# Patient Record
Sex: Female | Born: 1960
Health system: Southern US, Community
[De-identification: ages and names within clinical notes are randomized; demographics above are authoritative.]

## PROBLEM LIST (undated history)

## (undated) DIAGNOSIS — Q602 Renal agenesis, unspecified: Secondary | ICD-10-CM

## (undated) DIAGNOSIS — K219 Gastro-esophageal reflux disease without esophagitis: Secondary | ICD-10-CM

## (undated) DIAGNOSIS — G43909 Migraine, unspecified, not intractable, without status migrainosus: Secondary | ICD-10-CM

## (undated) DIAGNOSIS — T7840XA Allergy, unspecified, initial encounter: Secondary | ICD-10-CM

## (undated) HISTORY — PX: UPPER GASTROINTESTINAL ENDOSCOPY: SHX188

## (undated) HISTORY — PX: TONSILLECTOMY AND ADENOIDECTOMY: SHX28

## (undated) HISTORY — DX: Renal agenesis, unspecified: Q60.2

## (undated) HISTORY — DX: Allergy, unspecified, initial encounter: T78.40XA

## (undated) HISTORY — DX: Gastro-esophageal reflux disease without esophagitis: K21.9

## (undated) HISTORY — DX: Migraine, unspecified, not intractable, without status migrainosus: G43.909

## (undated) HISTORY — PX: RHINOPLASTY: SUR1284

---

## 1998-04-02 ENCOUNTER — Other Ambulatory Visit: Admission: RE | Admit: 1998-04-02 | Discharge: 1998-04-02 | Payer: Self-pay | Admitting: Obstetrics and Gynecology

## 1999-04-12 ENCOUNTER — Other Ambulatory Visit: Admission: RE | Admit: 1999-04-12 | Discharge: 1999-04-12 | Payer: Self-pay | Admitting: Gynecology

## 2000-04-11 ENCOUNTER — Other Ambulatory Visit: Admission: RE | Admit: 2000-04-11 | Discharge: 2000-04-11 | Payer: Self-pay | Admitting: Gynecology

## 2000-04-22 ENCOUNTER — Other Ambulatory Visit: Admission: RE | Admit: 2000-04-22 | Discharge: 2000-04-22 | Payer: Self-pay | Admitting: Gynecology

## 2001-04-13 ENCOUNTER — Other Ambulatory Visit: Admission: RE | Admit: 2001-04-13 | Discharge: 2001-04-13 | Payer: Self-pay | Admitting: Gynecology

## 2002-03-04 ENCOUNTER — Other Ambulatory Visit: Admission: RE | Admit: 2002-03-04 | Discharge: 2002-03-04 | Payer: Self-pay | Admitting: Gynecology

## 2002-09-04 ENCOUNTER — Encounter: Admission: RE | Admit: 2002-09-04 | Discharge: 2002-09-04 | Payer: Self-pay | Admitting: Family Medicine

## 2002-09-04 ENCOUNTER — Encounter: Payer: Self-pay | Admitting: Family Medicine

## 2003-03-18 ENCOUNTER — Other Ambulatory Visit: Admission: RE | Admit: 2003-03-18 | Discharge: 2003-03-18 | Payer: Self-pay | Admitting: Gynecology

## 2003-07-18 ENCOUNTER — Other Ambulatory Visit: Admission: RE | Admit: 2003-07-18 | Discharge: 2003-07-18 | Payer: Self-pay | Admitting: Gynecology

## 2003-10-16 ENCOUNTER — Encounter: Admission: RE | Admit: 2003-10-16 | Discharge: 2003-10-16 | Payer: Self-pay | Admitting: Gynecology

## 2004-06-02 ENCOUNTER — Other Ambulatory Visit: Admission: RE | Admit: 2004-06-02 | Discharge: 2004-06-02 | Payer: Self-pay | Admitting: Gynecology

## 2004-08-17 ENCOUNTER — Ambulatory Visit: Payer: Self-pay | Admitting: Family Medicine

## 2004-10-12 ENCOUNTER — Ambulatory Visit: Payer: Self-pay | Admitting: Family Medicine

## 2004-10-28 ENCOUNTER — Encounter: Admission: RE | Admit: 2004-10-28 | Discharge: 2004-10-28 | Payer: Self-pay | Admitting: Gynecology

## 2005-06-06 ENCOUNTER — Other Ambulatory Visit: Admission: RE | Admit: 2005-06-06 | Discharge: 2005-06-06 | Payer: Self-pay | Admitting: Gynecology

## 2005-07-21 ENCOUNTER — Ambulatory Visit: Payer: Self-pay | Admitting: Family Medicine

## 2005-08-19 HISTORY — PX: AUGMENTATION MAMMAPLASTY: SUR837

## 2005-11-24 ENCOUNTER — Encounter: Admission: RE | Admit: 2005-11-24 | Discharge: 2005-11-24 | Payer: Self-pay | Admitting: Gynecology

## 2006-01-05 ENCOUNTER — Ambulatory Visit: Payer: Self-pay | Admitting: Family Medicine

## 2006-01-06 ENCOUNTER — Ambulatory Visit: Payer: Self-pay | Admitting: Gastroenterology

## 2006-01-19 ENCOUNTER — Ambulatory Visit: Payer: Self-pay | Admitting: Gastroenterology

## 2006-01-20 ENCOUNTER — Ambulatory Visit: Payer: Self-pay | Admitting: Gastroenterology

## 2006-01-30 ENCOUNTER — Encounter (INDEPENDENT_AMBULATORY_CARE_PROVIDER_SITE_OTHER): Payer: Self-pay | Admitting: Specialist

## 2006-01-30 ENCOUNTER — Ambulatory Visit: Payer: Self-pay | Admitting: Gastroenterology

## 2006-02-06 ENCOUNTER — Ambulatory Visit: Payer: Self-pay | Admitting: Family Medicine

## 2006-02-22 ENCOUNTER — Ambulatory Visit: Payer: Self-pay | Admitting: Gastroenterology

## 2006-02-23 ENCOUNTER — Ambulatory Visit: Payer: Self-pay | Admitting: Internal Medicine

## 2006-05-30 ENCOUNTER — Ambulatory Visit: Payer: Self-pay | Admitting: Gastroenterology

## 2006-06-08 ENCOUNTER — Other Ambulatory Visit: Admission: RE | Admit: 2006-06-08 | Discharge: 2006-06-08 | Payer: Self-pay | Admitting: Gynecology

## 2006-11-28 ENCOUNTER — Encounter: Admission: RE | Admit: 2006-11-28 | Discharge: 2006-11-28 | Payer: Self-pay | Admitting: Gynecology

## 2007-06-11 ENCOUNTER — Other Ambulatory Visit: Admission: RE | Admit: 2007-06-11 | Discharge: 2007-06-11 | Payer: Self-pay | Admitting: Gynecology

## 2008-01-22 ENCOUNTER — Encounter: Admission: RE | Admit: 2008-01-22 | Discharge: 2008-01-22 | Payer: Self-pay | Admitting: Gynecology

## 2008-06-25 ENCOUNTER — Ambulatory Visit: Payer: Self-pay | Admitting: Gynecology

## 2008-06-25 ENCOUNTER — Other Ambulatory Visit: Admission: RE | Admit: 2008-06-25 | Discharge: 2008-06-25 | Payer: Self-pay | Admitting: Gynecology

## 2008-06-25 ENCOUNTER — Encounter: Payer: Self-pay | Admitting: Gynecology

## 2008-07-21 ENCOUNTER — Ambulatory Visit: Payer: Self-pay | Admitting: Gynecology

## 2009-01-28 ENCOUNTER — Encounter: Admission: RE | Admit: 2009-01-28 | Discharge: 2009-01-28 | Payer: Self-pay | Admitting: Gynecology

## 2009-04-16 ENCOUNTER — Ambulatory Visit: Payer: Self-pay | Admitting: Gynecology

## 2009-07-01 ENCOUNTER — Other Ambulatory Visit: Admission: RE | Admit: 2009-07-01 | Discharge: 2009-07-01 | Payer: Self-pay | Admitting: Gynecology

## 2009-07-01 ENCOUNTER — Encounter: Payer: Self-pay | Admitting: Gynecology

## 2009-07-01 ENCOUNTER — Ambulatory Visit: Payer: Self-pay | Admitting: Gynecology

## 2010-02-03 ENCOUNTER — Encounter: Admission: RE | Admit: 2010-02-03 | Discharge: 2010-02-03 | Payer: Self-pay | Admitting: Gynecology

## 2010-07-02 ENCOUNTER — Ambulatory Visit: Payer: Self-pay | Admitting: Gynecology

## 2010-07-15 ENCOUNTER — Other Ambulatory Visit
Admission: RE | Admit: 2010-07-15 | Discharge: 2010-07-15 | Payer: Self-pay | Source: Home / Self Care | Admitting: Gynecology

## 2010-07-15 ENCOUNTER — Ambulatory Visit: Payer: Self-pay | Admitting: Gynecology

## 2010-07-16 ENCOUNTER — Ambulatory Visit: Payer: Self-pay | Admitting: Gynecology

## 2010-07-16 HISTORY — PX: INTRAUTERINE DEVICE INSERTION: SHX323

## 2010-07-20 ENCOUNTER — Ambulatory Visit: Payer: Self-pay | Admitting: Gynecology

## 2010-08-20 ENCOUNTER — Ambulatory Visit: Payer: Self-pay | Admitting: Gynecology

## 2010-10-10 ENCOUNTER — Encounter: Payer: Self-pay | Admitting: Gynecology

## 2010-12-17 ENCOUNTER — Ambulatory Visit
Admission: RE | Admit: 2010-12-17 | Discharge: 2010-12-17 | Disposition: A | Discharge status: Home / Self Care | Payer: BC Managed Care – PPO | Source: Ambulatory Visit | Attending: Otolaryngology | Admitting: Otolaryngology

## 2010-12-17 ENCOUNTER — Other Ambulatory Visit: Payer: Self-pay | Admitting: Otolaryngology

## 2010-12-17 ENCOUNTER — Other Ambulatory Visit: Payer: Self-pay

## 2010-12-17 DIAGNOSIS — H9120 Sudden idiopathic hearing loss, unspecified ear: Secondary | ICD-10-CM

## 2010-12-17 DIAGNOSIS — R42 Dizziness and giddiness: Secondary | ICD-10-CM

## 2011-01-05 ENCOUNTER — Other Ambulatory Visit: Payer: Self-pay | Admitting: Gynecology

## 2011-01-05 DIAGNOSIS — Z1231 Encounter for screening mammogram for malignant neoplasm of breast: Secondary | ICD-10-CM

## 2011-02-04 NOTE — Assessment & Plan Note (Signed)
[facility]                           GASTROENTEROLOGY OFFICE NOTE   NAME:Ho, Andrea HACKBART                        MRN:          [id number]  DATE:05/30/2006                            DOB:          06/26/1961    Return office visit for dyspepsia and GERD.  She states her gastrointestinal  symptoms were not very active this summer, particularly when she was on  vacation.  She notes that with increasing stress her symptoms seem to be  more active, and she has more foods that seem to precipitate reflux  symptoms.  She still has problems with gas and bloating, and these symptoms  are helped with the use of Levsin.  She would like to try other proton pump  inhibitors to see if her reflux symptoms can be better controlled.   MEDICATIONS:  Listed on the chart, updated and reviewed.   MEDICATION ALLERGIES:  CODEINE.   PHYSICAL EXAMINATION:  In no acute distress.  Weight 132.8 pounds, blood  pressure is 112/76, pulse is 64 and regular.  CHEST:  Clear to auscultation bilaterally.  CARDIAC:  Regular rate and rhythm without murmurs.  ABDOMEN:  Soft and nontender, with normal active bowel sounds, no palpable  organomegaly, masses or hernias.   ASSESSMENT AND PLAN:  Gastroesophageal reflux disease and dyspepsia.  Rule  out gastroparesis.  Trial of Zegerid 40 mg q.a.m. for one week, and Nexium  40 mg q.a.m. for one week.  She will call to notify us which proton pump  inhibitor seems to be more effective.  We may need to try a proton pump  inhibitor twice a day on occasion.  Continue the regular use of Levsin for  gas bloating and abdominal discomfort.  Return office visit one year.                                   Venita Lick. Russella Dar, MD, [facility]   MTS/MedQ  DD:  05/31/2006  DT:  06/01/2006  Job #:  [id number]

## 2011-02-15 ENCOUNTER — Ambulatory Visit: Payer: BC Managed Care – PPO

## 2011-02-23 ENCOUNTER — Ambulatory Visit
Admission: RE | Admit: 2011-02-23 | Discharge: 2011-02-23 | Disposition: A | Discharge status: Home / Self Care | Payer: BC Managed Care – PPO | Source: Ambulatory Visit | Attending: Gynecology | Admitting: Gynecology

## 2011-02-23 DIAGNOSIS — Z1231 Encounter for screening mammogram for malignant neoplasm of breast: Secondary | ICD-10-CM

## 2011-07-20 ENCOUNTER — Ambulatory Visit (INDEPENDENT_AMBULATORY_CARE_PROVIDER_SITE_OTHER): Payer: BC Managed Care – PPO | Admitting: Gynecology

## 2011-07-20 ENCOUNTER — Other Ambulatory Visit (HOSPITAL_COMMUNITY)
Admission: RE | Admit: 2011-07-20 | Discharge: 2011-07-20 | Disposition: A | Discharge status: Home / Self Care | Payer: BC Managed Care – PPO | Source: Ambulatory Visit | Attending: Gynecology | Admitting: Gynecology

## 2011-07-20 ENCOUNTER — Encounter: Payer: Self-pay | Admitting: Gynecology

## 2011-07-20 VITALS — BP 118/70 | Ht 65.0 in | Wt 125.0 lb

## 2011-07-20 DIAGNOSIS — Z01419 Encounter for gynecological examination (general) (routine) without abnormal findings: Secondary | ICD-10-CM

## 2011-07-20 DIAGNOSIS — N898 Other specified noninflammatory disorders of vagina: Secondary | ICD-10-CM

## 2011-07-20 DIAGNOSIS — B373 Candidiasis of vulva and vagina: Secondary | ICD-10-CM

## 2011-07-20 DIAGNOSIS — L293 Anogenital pruritus, unspecified: Secondary | ICD-10-CM

## 2011-07-20 DIAGNOSIS — N951 Menopausal and female climacteric states: Secondary | ICD-10-CM

## 2011-07-20 DIAGNOSIS — N952 Postmenopausal atrophic vaginitis: Secondary | ICD-10-CM

## 2011-07-20 DIAGNOSIS — Z1211 Encounter for screening for malignant neoplasm of colon: Secondary | ICD-10-CM

## 2011-07-20 MED ORDER — ESTROGENS, CONJUGATED 0.625 MG/GM VA CREA: TOPICAL_CREAM | VAGINAL | Status: DC

## 2011-07-20 MED ORDER — FLUCONAZOLE 150 MG PO TABS: 150.0000 mg | ORAL_TABLET | Freq: Once | ORAL | Status: AC

## 2011-07-20 NOTE — Progress Notes (Signed)
Andrea Ho 08-07-1961 [id number]   History:    50 y.o.  for annual exam with the only complaint is of vulvar parotid is. Patient has been menopausal for ovary or a year since the ParaGard T380A IUD was placed. She does have some vasomotor symptoms and vaginal dryness but she has been hesitant on starting any hormone replacement therapy. She does suffer from dyspareunia as a result of her vaginal atrophy. Review of her record indicated her mammogram was in April of this year which was normal. Patient does her monthly self breast examinations. She had saline implants.  Past medical history,surgical history, family history and social history were all reviewed and documented in the EPIC chart.  ROS:  Was performed and pertinent positives and negatives are included in the history.  Exam: chaperone present BP 118/70  Ht 5\' 5"  (1.651 m)  Wt 125 lb (56.7 kg)  BMI 20.80 kg/m2  Body mass index is 20.80 kg/(m^2).  General appearance : Well developed well nourished female. No acute distress HEENT: Neck supple, trachea midline, no carotid bruits, no thyroidmegaly Lungs: Clear to auscultation, no rhonchi or wheezes, or rib retractions  Heart: Regular rate and rhythm, no murmurs or gallops Breast:Examined in sitting and supine position were symmetrical in appearance, no palpable masses or tenderness,  no skin retraction, no nipple inversion, no nipple discharge, no skin discoloration, no axillary or supraclavicular lymphadenopathy Abdomen: no palpable masses or tenderness, no rebound or guarding Extremities: no edema or skin discoloration or tenderness  Pelvic:  Bartholin, Urethra, Skene Glands: Within normal limits             Vagina: No gross lesions or discharge  Cervix: No gross lesions or discharge IUD string seen  Uterus  anteverted, normal size, shape and consistency, non-tender and mobile  Adnexa  Without masses or tenderness  Anus and perineum  normal   Rectovaginal  normal sphincter tone  without palpated masses or tenderness             Hemoccult done resulting in time of this dictation     Assessment/Plan:  50 y.o. female for annual exam with wet prep demonstrated evidence of moniliasis. She'll be placed on Diflucan 150 mg one by mouth today. We discussed low-dose vaginal estrogen cream to apply twice a week for her severe vaginal atrophy and dyspareunia. The risks benefits and pros and cons were discussed. She'll be started on Premarin vaginal cream to apply twice a week. She'll continue to monitor her blood pressures. Patient is being followed by her primary physician who does her lab work so no additional lap will be drawn today. Her urinalysis was negative and her Pap smear was done today. She was reminded to schedule her screening colonoscopy. And we will plan next year on getting a baseline bone density study. She was instructed to take her calcium and vitamin D twice a day along with weightbearing exercises for osteoporosis prevention.    Ok Edwards MD, 1:31 PM 07/20/2011

## 2011-07-20 NOTE — Progress Notes (Signed)
Addended by: Cammie Mcgee T on: 07/20/2011 02:44 PM   Modules accepted: Orders

## 2011-07-20 NOTE — Patient Instructions (Signed)
Apply premarin vaginal cream twice a week and continue to monitor blood pressure. This low dose twice a week should not make a change but never the less lets keep an eye on it. The diflucan tablet is for the yeast infection just take one.

## 2012-01-02 ENCOUNTER — Encounter: Payer: Self-pay | Admitting: Gynecology

## 2012-01-02 ENCOUNTER — Encounter: Payer: Self-pay | Admitting: *Deleted

## 2012-01-02 ENCOUNTER — Ambulatory Visit (INDEPENDENT_AMBULATORY_CARE_PROVIDER_SITE_OTHER): Payer: BC Managed Care – PPO | Admitting: Gynecology

## 2012-01-02 DIAGNOSIS — Q600 Renal agenesis, unilateral: Secondary | ICD-10-CM | POA: Insufficient documentation

## 2012-01-02 DIAGNOSIS — N952 Postmenopausal atrophic vaginitis: Secondary | ICD-10-CM

## 2012-01-02 DIAGNOSIS — R1031 Right lower quadrant pain: Secondary | ICD-10-CM

## 2012-01-02 DIAGNOSIS — N898 Other specified noninflammatory disorders of vagina: Secondary | ICD-10-CM

## 2012-01-02 DIAGNOSIS — R109 Unspecified abdominal pain: Secondary | ICD-10-CM

## 2012-01-02 DIAGNOSIS — L293 Anogenital pruritus, unspecified: Secondary | ICD-10-CM

## 2012-01-02 LAB — URINALYSIS W MICROSCOPIC + REFLEX CULTURE
Ketones, ur: NEGATIVE mg/dL
Nitrite: NEGATIVE
Protein, ur: NEGATIVE mg/dL
Specific Gravity, Urine: <1.005 — ABNORMAL LOW (ref 1.005–1.030)
Urobilinogen, UA: 0.2 mg/dL (ref 0.0–1.0)

## 2012-01-02 LAB — WET PREP FOR TRICH, YEAST, CLUE
Clue Cells Wet Prep HPF POC: NONE SEEN
Trich, Wet Prep: NONE SEEN
WBC, Wet Prep HPF POC: NONE SEEN

## 2012-01-02 MED ORDER — ESTROGENS, CONJUGATED 0.625 MG/GM VA CREA: TOPICAL_CREAM | VAGINAL | Status: DC

## 2012-01-02 MED ORDER — FLUCONAZOLE 100 MG PO TABS: 100.0000 mg | ORAL_TABLET | Freq: Every day | ORAL | Status: AC

## 2012-01-02 NOTE — Patient Instructions (Signed)
Menopause Menopause is the normal time of life when menstrual periods stop completely. Menopause is complete when you have missed 12 consecutive menstrual periods. It usually occurs between the ages of 48 to 55, with an average age of 51. Very rarely does a woman develop menopause before 51 years old. At menopause, your ovaries stop producing the female hormones, estrogen and progesterone. This can cause undesirable symptoms and also affect your health. Sometimes the symptoms may occur 4 to 5 years before the menopause begins. There is no relationship between menopause and:  Oral contraceptives.   Number of children you had.   Race.   The age your menstrual periods started (menarche).  Heavy smokers and very thin women may develop menopause earlier in life. CAUSES  The ovaries stop producing the female hormones estrogen and progesterone.   Other causes include:   Surgery to remove both ovaries.   The ovaries stop functioning for no known reason.   Tumors of the pituitary gland in the brain.   Medical disease that affects the ovaries and hormone production.   Radiation treatment to the abdomen or pelvis.   Chemotherapy that affects the ovaries.  SYMPTOMS   Hot flashes.   Night sweats.   Decrease in sex drive.   Vaginal dryness and thinning of the vagina causing painful intercourse.   Dryness of the skin and developing wrinkles.   Headaches.   Tiredness.   Irritability.   Memory problems.   Weight gain.   Bladder infections.   Hair growth of the face and chest.   Infertility.  More serious symptoms include:  Loss of bone (osteoporosis) causing breaks (fractures).   Depression.   Hardening and narrowing of the arteries (atherosclerosis) causing heart attacks and strokes.  DIAGNOSIS   When the menstrual periods have stopped for 12 straight months.   Physical exam.   Hormone studies of the blood.  TREATMENT  There are many treatment choices and nearly  as many questions about them. The decisions to treat or not to treat menopausal changes is an individual choice made with your caregiver. Your caregiver can discuss the treatments with you. Together, you can decide which treatment will work best for you. Your treatment choices may include:   Hormone therapy (estorgen and progesterone).   Non-hormonal medications.   Treating the individual symptoms with medication (for example antidepressants for depression).   Herbal medications that may help specific symptoms.   Counseling by a psychiatrist or psychologist.   Group therapy.   Lifestyle changes including:   Eating healthy.   Regular exercise.   Limiting caffeine and alcohol.   Stress management and meditation.   No treatment.  HOME CARE INSTRUCTIONS   Take the medication your caregiver gives you as directed.   Get plenty of sleep and rest.   Exercise regularly.   Eat a diet that contains calcium (good for the bones) and soy products (acts like estrogen hormone).   Avoid alcoholic beverages.   Do not smoke.   If you have hot flashes, dress in layers.   Take supplements, calcium and vitamin D to strengthen bones.   You can use over-the-counter lubricants or moisturizers for vaginal dryness.   Group therapy is sometimes very helpful.   Acupuncture may be helpful in some cases.  SEEK MEDICAL CARE IF:   You are not sure you are in menopause.   You are having menopausal symptoms and need advice and treatment.   You are still having menstrual periods after age 55.     You have pain with intercourse.   Menopause is complete (no menstrual period for 12 months) and you develop vaginal bleeding.   You need a referral to a specialist (gynecologist, psychiatrist or psychologist) for treatment.  SEEK IMMEDIATE MEDICAL CARE IF:   You have severe depression.   You have excessive vaginal bleeding.   You fell and think you have a broken bone.   You have pain when you  urinate.   You develop leg or chest pain.   You have a fast pounding heart beat (palpitations).   You have severe headaches.   You develop vision problems.   You feel a lump in your breast.   You have abdominal pain or severe indigestion.  Document Released: 11/26/2003 Document Revised: 08/25/2011 Document Reviewed: 07/03/2008 [facility] Patient Information 2012 [place], [place].  Apply the Premarin Vaginal Cream every night for 1 week then twice a week there after for vaginal health  The Diflucan is ready to pick up at the pharmacy for the yeast just take one tablet tonight

## 2012-01-02 NOTE — Progress Notes (Signed)
Patient presented to the office today complaining of vulvar pruritus. In 2011 [facility] had demonstrated that she was menopausal. And had issues with vaginal atrophy. She had been prescribed Premarin vaginal cream to apply twice a week but never started it because of concern or her blood pressure. She does have a ParaGard T380A IUD that was placed 2 years ago. She denies any vaginal bleeding. She has a history of congenital right renal agenesis. She has some vague suprapubic discomfort and was concerned she may have a urinary tract infection. She denied any dysuria or frequency or back pain.  Exam: Bartholin urethra Skene glands with atrophic changes Vagina: No gross lesions or discharge atrophy was noted. Cervix: IUD string was seen Uterus: Normal size shape and consistency anteverted Adnexa: No palpable masses or tenderness Rectal: Not examined  Wet prep evidence of moniliasis / urinalysis negative  Assessment/plan: Wet prep with moniliasis. Diflucan 150 mg was prescribed. She would like to restart the Premarin vaginal cream. I've asked her to apply intravaginally and external vulva each bedtime for one week then to apply twice a week thereafter. Women's health initiative study was discussed. She'll continue to monitor her blood pressure. She scheduled to return back to the office in October of this year. Literature information on the menopause was provided.

## 2012-01-02 NOTE — Progress Notes (Signed)
Addended by: Ok Edwards on: 01/02/2012 02:34 PM   Modules accepted: Orders

## 2012-01-02 NOTE — Progress Notes (Signed)
Patient ID: Andrea Ho, female   DOB: May 13, 1961, 51 y.o.   MRN: [id number] Pt was given diflucan 150 mg tablet today #2. Pt thought JF only said 1 pill. Pt informed to take 1 today and 1 pill tomorrow.

## 2012-01-03 ENCOUNTER — Telehealth: Payer: Self-pay | Admitting: *Deleted

## 2012-01-03 NOTE — Telephone Encounter (Signed)
For the first week when she starts that she is applied intravaginally every night she should use half applicator. After the first week when she is orally going to be using it twice a week she should use the full applicator and apply some on her fingers to apply externally as well for the atrophy

## 2012-01-03 NOTE — Telephone Encounter (Signed)
Pt was giving premarin 0.625 tube and was told to to full applicator about half way, which would be about 1 1/2 gram. Pt wanted to know if this was enough? Pt read the booklet and it said use cherry size amount. Please advise

## 2012-01-03 NOTE — Telephone Encounter (Signed)
Pt informed with the below note,  

## 2012-01-16 ENCOUNTER — Other Ambulatory Visit: Payer: Self-pay | Admitting: Gynecology

## 2012-01-16 DIAGNOSIS — Z1231 Encounter for screening mammogram for malignant neoplasm of breast: Secondary | ICD-10-CM

## 2012-02-24 ENCOUNTER — Ambulatory Visit
Admission: RE | Admit: 2012-02-24 | Discharge: 2012-02-24 | Disposition: A | Discharge status: Home / Self Care | Payer: BC Managed Care – PPO | Source: Ambulatory Visit | Attending: Gynecology | Admitting: Gynecology

## 2012-02-24 DIAGNOSIS — Z1231 Encounter for screening mammogram for malignant neoplasm of breast: Secondary | ICD-10-CM

## 2012-06-04 ENCOUNTER — Telehealth: Payer: Self-pay | Admitting: *Deleted

## 2012-06-04 NOTE — Telephone Encounter (Signed)
Pt left message on voicemail c/o IUD string? Left message on pt voicemail OV best.

## 2012-08-24 ENCOUNTER — Other Ambulatory Visit: Payer: Self-pay | Admitting: Obstetrics and Gynecology

## 2012-08-24 ENCOUNTER — Other Ambulatory Visit (HOSPITAL_COMMUNITY)
Admission: RE | Admit: 2012-08-24 | Discharge: 2012-08-24 | Disposition: A | Discharge status: Home / Self Care | Payer: BC Managed Care – PPO | Source: Ambulatory Visit | Attending: Obstetrics and Gynecology | Admitting: Obstetrics and Gynecology

## 2012-08-24 DIAGNOSIS — Z01419 Encounter for gynecological examination (general) (routine) without abnormal findings: Secondary | ICD-10-CM | POA: Insufficient documentation

## 2013-01-03 ENCOUNTER — Encounter: Payer: Self-pay | Admitting: Gastroenterology

## 2013-01-24 ENCOUNTER — Other Ambulatory Visit: Payer: Self-pay

## 2013-01-24 DIAGNOSIS — Z1231 Encounter for screening mammogram for malignant neoplasm of breast: Secondary | ICD-10-CM

## 2013-01-25 ENCOUNTER — Ambulatory Visit (AMBULATORY_SURGERY_CENTER): Payer: No Typology Code available for payment source | Admitting: *Deleted

## 2013-01-25 VITALS — Ht 66.0 in | Wt 130.0 lb

## 2013-01-25 DIAGNOSIS — Z1211 Encounter for screening for malignant neoplasm of colon: Secondary | ICD-10-CM

## 2013-01-25 MED ORDER — MOVIPREP 100 G PO SOLR: ORAL | Status: DC

## 2013-02-05 ENCOUNTER — Telehealth: Payer: Self-pay | Admitting: Gastroenterology

## 2013-02-05 NOTE — Telephone Encounter (Signed)
Yes charge 

## 2013-02-07 ENCOUNTER — Encounter: Payer: BC Managed Care – PPO | Admitting: Gastroenterology

## 2013-03-05 ENCOUNTER — Telehealth: Payer: Self-pay | Admitting: Gastroenterology

## 2013-03-05 NOTE — Telephone Encounter (Signed)
Message copied by Arna Snipe on Tue Mar 05, 2013 11:41 AM ------      Message from: Mckinley Jewel, AMY L      Created: Wed Feb 06, 2013 11:40 AM      Regarding: FW: cancellation charge                   ----- Message -----         From: Domingo Sep         Sent: 02/06/2013  11:16 AM           To: Otto Herb, CNA      Subject: RE: cancellation charge                                  Don't charge.  Patient's husband died last 2023-02-06.            ----- Message -----         From: Otto Herb, CNA         Sent: 02/06/2013  10:37 AM           To: Domingo Sep      Subject: cancellation charge                                      Pt called to cancel because husband passed away 2023/02/04.  Dr. Russella Dar is saying to charge her :( can you check on this? I won't send to [facility] yet.            Thanks Amy       ------

## 2013-03-29 ENCOUNTER — Ambulatory Visit
Admission: RE | Admit: 2013-03-29 | Discharge: 2013-03-29 | Disposition: A | Discharge status: Home / Self Care | Payer: BC Managed Care – PPO | Source: Ambulatory Visit

## 2013-03-29 DIAGNOSIS — Z1231 Encounter for screening mammogram for malignant neoplasm of breast: Secondary | ICD-10-CM

## 2013-10-01 ENCOUNTER — Other Ambulatory Visit (HOSPITAL_COMMUNITY)
Admission: RE | Admit: 2013-10-01 | Discharge: 2013-10-01 | Disposition: A | Discharge status: Home / Self Care | Payer: BC Managed Care – PPO | Source: Ambulatory Visit | Attending: Obstetrics and Gynecology | Admitting: Obstetrics and Gynecology

## 2013-10-01 ENCOUNTER — Other Ambulatory Visit: Payer: Self-pay | Admitting: Obstetrics and Gynecology

## 2013-10-01 DIAGNOSIS — Z124 Encounter for screening for malignant neoplasm of cervix: Secondary | ICD-10-CM | POA: Insufficient documentation

## 2013-10-01 DIAGNOSIS — Z1151 Encounter for screening for human papillomavirus (HPV): Secondary | ICD-10-CM | POA: Insufficient documentation

## 2014-01-20 ENCOUNTER — Other Ambulatory Visit: Payer: Self-pay | Admitting: Dermatology

## 2014-02-26 ENCOUNTER — Other Ambulatory Visit: Payer: Self-pay

## 2014-02-26 DIAGNOSIS — Z1231 Encounter for screening mammogram for malignant neoplasm of breast: Secondary | ICD-10-CM

## 2014-04-02 ENCOUNTER — Ambulatory Visit
Admission: RE | Admit: 2014-04-02 | Discharge: 2014-04-02 | Disposition: A | Discharge status: Home / Self Care | Payer: BC Managed Care – PPO | Source: Ambulatory Visit

## 2014-04-02 DIAGNOSIS — Z1231 Encounter for screening mammogram for malignant neoplasm of breast: Secondary | ICD-10-CM

## 2014-04-11 ENCOUNTER — Telehealth: Payer: Self-pay | Admitting: Cardiology

## 2014-04-11 NOTE — Telephone Encounter (Signed)
New message           C/o cp when exercising / not a constant pain but comes and goes

## 2014-04-11 NOTE — Telephone Encounter (Signed)
Called stating she has noticed recently that she is more SOB when she is exercising.  States her heart rate goes up to 120; also c/o being more fatigued. Last seen by Dr. Anne FuSkains 04/04/12. Received records for 7/17 from [facility] system.  Made her an appointment 8/5 with Sunday SpillersLori Gerhardt,NP.  Advised if continues to have SOB to call back.  Needs to stay hydrated and avoid excessive exercise during hot weather. She understands and agrees to plan.

## 2014-04-19 ENCOUNTER — Encounter: Payer: Self-pay | Admitting: *Deleted

## 2014-04-23 ENCOUNTER — Other Ambulatory Visit: Payer: Self-pay | Admitting: *Deleted

## 2014-04-23 ENCOUNTER — Ambulatory Visit (INDEPENDENT_AMBULATORY_CARE_PROVIDER_SITE_OTHER): Payer: BC Managed Care – PPO | Admitting: Nurse Practitioner

## 2014-04-23 ENCOUNTER — Telehealth: Payer: Self-pay | Admitting: *Deleted

## 2014-04-23 ENCOUNTER — Encounter: Payer: Self-pay | Admitting: Nurse Practitioner

## 2014-04-23 VITALS — BP 140/92 | HR 65 | Ht 66.0 in | Wt 132.8 lb

## 2014-04-23 DIAGNOSIS — R0602 Shortness of breath: Secondary | ICD-10-CM

## 2014-04-23 DIAGNOSIS — I10 Essential (primary) hypertension: Secondary | ICD-10-CM

## 2014-04-23 DIAGNOSIS — R0789 Other chest pain: Secondary | ICD-10-CM

## 2014-04-23 DIAGNOSIS — R079 Chest pain, unspecified: Secondary | ICD-10-CM

## 2014-04-23 LAB — BASIC METABOLIC PANEL
BUN: 15 mg/dL (ref 6–23)
CO2: 30 mEq/L (ref 19–32)
Calcium: 9.3 mg/dL (ref 8.4–10.5)
Chloride: 104 mEq/L (ref 96–112)
Creatinine, Ser: 0.9 mg/dL (ref 0.4–1.2)
GFR: 72.4 mL/min (ref >60.00)
Glucose, Bld: 74 mg/dL (ref 70–99)
Potassium: 4.3 mEq/L (ref 3.5–5.1)
Sodium: 139 mEq/L (ref 135–145)

## 2014-04-23 LAB — HEPATIC FUNCTION PANEL
ALT: 17 U/L (ref 0–35)
AST: 27 U/L (ref 0–37)
Albumin: 4.1 g/dL (ref 3.5–5.2)
Alkaline Phosphatase: 69 U/L (ref 39–117)
Bilirubin, Direct: 0.1 mg/dL (ref 0.0–0.3)
Total Bilirubin: 0.7 mg/dL (ref 0.2–1.2)
Total Protein: 6.9 g/dL (ref 6.0–8.3)

## 2014-04-23 LAB — LIPID PANEL
Cholesterol: 222 mg/dL — ABNORMAL HIGH (ref 0–200)
HDL: 81.4 mg/dL (ref >39.00)
LDL Cholesterol: 125 mg/dL — ABNORMAL HIGH (ref 0–99)
NonHDL: 140.6
Total CHOL/HDL Ratio: 3
Triglycerides: 80 mg/dL (ref 0.0–149.0)
VLDL: 16 mg/dL (ref 0.0–40.0)

## 2014-04-23 LAB — CBC
HCT: 45.2 % (ref 36.0–46.0)
Hemoglobin: 14.9 g/dL (ref 12.0–15.0)
MCHC: 33 g/dL (ref 30.0–36.0)
MCV: 92.9 fl (ref 78.0–100.0)
Platelets: 185 10*3/uL (ref 150.0–400.0)
RBC: 4.87 Mil/uL (ref 3.87–5.11)
RDW: 12.8 % (ref 11.5–15.5)
WBC: 6.6 10*3/uL (ref 4.0–10.5)

## 2014-04-23 LAB — TSH: TSH: 4.03 u[IU]/mL (ref 0.35–4.50)

## 2014-04-23 MED ORDER — ATORVASTATIN CALCIUM 10 MG PO TABS: 10.0000 mg | ORAL_TABLET | Freq: Every day | ORAL | Status: DC

## 2014-04-23 NOTE — Telephone Encounter (Signed)
Left message on machine for pt to contact the office. Pt was set up to get Exercise stress test tomorrow pre-cert contacted us, pt's insurance will not cover test.  Norma FredricksonLori Gerhardt contacted ins co and still would not cover test.  Sent to schedulers pt will  Have to get a POET.  Waiting for pt to return call.

## 2014-04-23 NOTE — Patient Instructions (Addendum)
Stay on your current medicines  You may add back your Omeprazole for the next 2 weeks to see if this helps you feel better  We will check labs today  We will arrange for a stress Myoview  Call the [facility] office at [phone] if you have any questions, problems or concerns.

## 2014-04-23 NOTE — Progress Notes (Addendum)
Andrea Ho Date of Birth: 1961-06-03 Medical Record #[id number]  History of Present Illness: [name] is seen back today for a work in visit. Seen for Dr. Anne Fu. She is a 53 year old female with a history of tachycardia with exercise - evaluated back in Feb 15, 2012 -  Low risk GXT and normal echo. She has never smoked. She is an LPN. Her other issues include situational stress (husband died in 02/14/13 with an MI - resulted in bankruptcy and foreclosure), migraines, solitary left kidney due to congenital defect, and mild HTN.  Last seen by Dr. Anne Fu in 2012-02-15.  Called about 10 days ago with more DOE with exercise. HR up to 120. More fatigued.   Comes in today. Here alone. Has had a pressure/squeezing sensation around her neck and in both arms - been going on for several months - comes and goes but nothing she can do really triggers. Worse with stress but still has when she does not feel stressed. HR going up too quick for her. She remains active. Eating out. No recent labs. Overall fatigued and "can't go like I use to". Short of breath with exertion. If she bends over, she feels the blood rushing to her head. No longer on her PPI - was worried about long term effects. Still with lots of stress with 77 year old son going to college. BP has been mildly elevated but not consistent. Admits to using too much salt and eating out too much. Says her last check of her lipids were up.  Current Outpatient Prescriptions  Medication Sig Dispense Refill  . calcium carbonate (OS-CAL) 600 MG TABS Take 600 mg by mouth 2 (two) times daily with a meal.        . clindamycin (CLEOCIN) 150 MG capsule Take 150 mg by mouth 4 (four) times daily.       . Multiple Vitamins-Minerals (OCUVITE PO) Take by mouth as needed.       . rizatriptan (MAXALT) 10 MG tablet Take 10 mg by mouth as needed. May repeat in 2 hours if needed       . omeprazole (PRILOSEC) 20 MG capsule Take 20 mg by mouth daily.       No current  facility-administered medications for this visit.    Allergies  Allergen Reactions  . Bactrim Nausea Only  . Codeine Nausea And Vomiting  . Other     mushrooms    Past Medical History  Diagnosis Date  . Migraines   . Allergy     mushrooms  . GERD (gastroesophageal reflux disease)   . Renal agenesis     CONGENITAL, RIGHT,  only kidney    Past Surgical History  Procedure Laterality Date  . Intrauterine device insertion  07/16/2010    PARAGUARD  . Tonsillectomy and adenoidectomy    . Rhinoplasty    . Augmentation mammaplasty  08/2005    SALINE,  DR. BEAN    History  Smoking status  . Never Smoker   Smokeless tobacco  . Never Used    History  Alcohol Use  . 2.4 oz/week  . 2 Glasses of wine, 2 Cans of beer per week    Comment: 3 - 4 times a week.Marland KitchenBEER, WINE    Family History  Problem Relation Age of Onset  . Hypertension Mother   . Heart attack Mother 24  . Cervical cancer Mother   . Hypertension Father   . Stroke Father     hx of  TIA'S  . Colon cancer Paternal Grandmother [id number]  . Heart attack Father   . Hypertension Sister     Review of Systems: The review of systems is per the HPI.  All other systems were reviewed and are negative.  Physical Exam: BP 140/92  Pulse 65  Ht 5\' 6"  (1.676 m)  Wt 132 lb 12.8 oz (60.238 kg)  BMI 21.44 kg/m2 Patient is very pleasant and in no acute distress. Skin is warm and dry. Color is normal.  HEENT is unremarkable. Normocephalic/atraumatic. PERRL. Sclera are nonicteric. Neck is supple. No masses. No JVD. Lungs are clear. Cardiac exam shows a regular rate and rhythm. Abdomen is soft. Extremities are without edema. Gait and ROM are intact. No gross neurologic deficits noted.  Wt Readings from Last 3 Encounters:  04/23/14 132 lb 12.8 oz (60.238 kg)  01/25/13 130 lb (58.968 kg)  07/20/11 125 lb (56.7 kg)    LABORATORY DATA/PROCEDURES: EKG with sinus rhythm. T wave inversion in V2. Reviewed with Dr. Anne FuSkains  No results  found for this basename: WBC,  HGB,  HCT,  PLT,  GLUCOSE,  CHOL,  TRIG,  HDL,  LDLDIRECT,  LDLCALC,  ALT,  AST,  NA,  K,  CL,  CREATININE,  BUN,  CO2,  TSH,  PSA,  INR,  GLUF,  HGBA1C,  MICROALBUR    BNP (last 3 results) No results found for this basename: PROBNP,  in the last 8760 hours   Assessment / Plan: 1. DOE/fatigue/neck pressure/arm pain - discussed with Dr. Anne FuSkains - will proceed on with stress Myoview. She has several risk factors - borderline HTN, strong FH of early CAD and EKG changes today.   2. Tachycardia with exercise - has had prior low risk GXT and normal echo from 2013.  3. HTN - will need to monitor  Will check her labs. Would treat lipids if LDL not below 100. Proceed with stress Myoview. Further disposition to follow.   Patient is agreeable to this plan and will call if any problems develop in the interim.   Rosalio MacadamiaLori C. Chares Slaymaker, RN, ANP-C [facility] [address] [place], [place]  [id number] [phone]   Addendum:  Have tried to get prior auth for the Myoview. Have done peer to peer review with patient's insurance company to no avail. Despite her symptoms, history and EKG she is not approved for a Myoview. The insurance company has cancelled the Myoview. She will need to have a GXT - if this is abnormal, then can have the Myoview.  Patient will be notified.   Rosalio MacadamiaLori C. Danniel Grenz, RN, ANP-C [facility] [address] [place], [place]  [address] [phone]

## 2014-04-23 NOTE — Telephone Encounter (Signed)
S/w pt was very upset insurance will not cover test pt is calling ins agent and will get back to me

## 2014-04-24 ENCOUNTER — Encounter (HOSPITAL_COMMUNITY): Payer: BC Managed Care – PPO

## 2014-04-30 ENCOUNTER — Encounter (HOSPITAL_COMMUNITY): Payer: BC Managed Care – PPO

## 2014-05-20 ENCOUNTER — Telehealth (HOSPITAL_COMMUNITY): Payer: Self-pay

## 2014-05-20 NOTE — Telephone Encounter (Signed)
Encounter complete. 

## 2014-05-22 ENCOUNTER — Ambulatory Visit (HOSPITAL_COMMUNITY)
Admission: RE | Admit: 2014-05-22 | Discharge: 2014-05-22 | Disposition: A | Discharge status: Home / Self Care | Payer: BC Managed Care – PPO | Source: Ambulatory Visit | Attending: Internal Medicine | Admitting: Internal Medicine

## 2014-05-22 DIAGNOSIS — R0602 Shortness of breath: Secondary | ICD-10-CM

## 2014-05-22 DIAGNOSIS — R079 Chest pain, unspecified: Secondary | ICD-10-CM | POA: Diagnosis not present

## 2014-05-22 DIAGNOSIS — R0789 Other chest pain: Secondary | ICD-10-CM

## 2014-05-22 NOTE — Procedures (Signed)
Exercise Treadmill Test  Pre-Exercise Testing Evaluation  NSR, normal tracing  Test  Exercise Tolerance Test Ordering MD: Norma Fredrickson, NP    Unique Test No: 1   Treadmill:  1  Indication for ETT: chest pain - rule out ischemia  Contraindication to ETT: No   Stress Modality: exercise - treadmill  Cardiac Imaging Performed: non   Protocol: standard Bruce - maximal  Max BP:  168/101  Max MPHR (bpm):  167 85% MPR (bpm):  142  MPHR obtained (bpm):  166 % MPHR obtained:  99  Reached 85% MPHR (min:sec):  9:30 Total Exercise Time (min-sec):  11:39   Workload in METS:  13.4 Borg Scale: 14  Reason ETT Terminated:  Fatigue and Weakness    ST Segment Analysis At Rest: normal ST segments - no evidence of significant ST depression With Exercise: no evidence of significant ST depression  Other Information Arrhythmia:  rare blocked PACs in recovery Angina during ETT:  absent (0) Quality of ETT:  diagnostic  ETT Interpretation:  normal - no evidence of ischemia by ST analysis  Comments: Good exercise tolerance Hypertensive response at peak exercise.  Thurmon Fair, MD, [facility] [phone] office [phone] pager

## 2014-05-23 ENCOUNTER — Telehealth: Payer: Self-pay | Admitting: *Deleted

## 2014-05-23 NOTE — Telephone Encounter (Signed)
lmom myoview normal;  

## 2014-07-10 ENCOUNTER — Other Ambulatory Visit: Payer: Self-pay | Admitting: Nurse Practitioner

## 2014-07-10 DIAGNOSIS — N644 Mastodynia: Secondary | ICD-10-CM

## 2014-07-21 ENCOUNTER — Encounter: Payer: Self-pay | Admitting: Nurse Practitioner

## 2014-07-22 ENCOUNTER — Other Ambulatory Visit: Payer: BC Managed Care – PPO

## 2014-07-24 ENCOUNTER — Ambulatory Visit
Admission: RE | Admit: 2014-07-24 | Discharge: 2014-07-24 | Disposition: A | Discharge status: Home / Self Care | Payer: BC Managed Care – PPO | Source: Ambulatory Visit | Attending: Nurse Practitioner | Admitting: Nurse Practitioner

## 2014-07-24 DIAGNOSIS — N644 Mastodynia: Secondary | ICD-10-CM

## 2014-08-26 HISTORY — PX: BREAST IMPLANT EXCHANGE: SHX6296

## 2015-09-07 ENCOUNTER — Other Ambulatory Visit (HOSPITAL_COMMUNITY)
Admission: RE | Admit: 2015-09-07 | Discharge: 2015-09-07 | Disposition: A | Discharge status: Home / Self Care | Payer: BLUE CROSS/BLUE SHIELD | Source: Ambulatory Visit | Attending: Obstetrics and Gynecology | Admitting: Obstetrics and Gynecology

## 2015-09-07 ENCOUNTER — Other Ambulatory Visit: Payer: Self-pay | Admitting: Obstetrics and Gynecology

## 2015-09-07 DIAGNOSIS — Z01419 Encounter for gynecological examination (general) (routine) without abnormal findings: Secondary | ICD-10-CM | POA: Diagnosis present

## 2015-09-09 LAB — CYTOLOGY - PAP

## 2016-01-20 DIAGNOSIS — Z13220 Encounter for screening for lipoid disorders: Secondary | ICD-10-CM | POA: Diagnosis not present

## 2016-01-20 DIAGNOSIS — Z0000 Encounter for general adult medical examination without abnormal findings: Secondary | ICD-10-CM | POA: Diagnosis not present

## 2016-02-12 DIAGNOSIS — L821 Other seborrheic keratosis: Secondary | ICD-10-CM | POA: Diagnosis not present

## 2016-02-12 DIAGNOSIS — L814 Other melanin hyperpigmentation: Secondary | ICD-10-CM | POA: Diagnosis not present

## 2016-02-12 DIAGNOSIS — D1801 Hemangioma of skin and subcutaneous tissue: Secondary | ICD-10-CM | POA: Diagnosis not present

## 2016-02-12 DIAGNOSIS — L905 Scar conditions and fibrosis of skin: Secondary | ICD-10-CM | POA: Diagnosis not present

## 2016-05-31 ENCOUNTER — Encounter (INDEPENDENT_AMBULATORY_CARE_PROVIDER_SITE_OTHER): Payer: Self-pay

## 2016-05-31 ENCOUNTER — Ambulatory Visit (INDEPENDENT_AMBULATORY_CARE_PROVIDER_SITE_OTHER): Payer: BLUE CROSS/BLUE SHIELD | Admitting: Gastroenterology

## 2016-05-31 ENCOUNTER — Encounter: Payer: Self-pay | Admitting: Gastroenterology

## 2016-05-31 VITALS — BP 104/68 | HR 84 | Ht 64.75 in | Wt 134.5 lb

## 2016-05-31 DIAGNOSIS — Z1211 Encounter for screening for malignant neoplasm of colon: Secondary | ICD-10-CM

## 2016-05-31 DIAGNOSIS — R112 Nausea with vomiting, unspecified: Secondary | ICD-10-CM | POA: Diagnosis not present

## 2016-05-31 DIAGNOSIS — Z9889 Other specified postprocedural states: Secondary | ICD-10-CM | POA: Diagnosis not present

## 2016-05-31 MED ORDER — PROMETHAZINE HCL 25 MG RE SUPP: RECTAL | 0 refills | Status: AC

## 2016-05-31 MED ORDER — NA SULFATE-K SULFATE-MG SULF 17.5-3.13-1.6 GM/177ML PO SOLN: 1.0000 | Freq: Once | ORAL | 0 refills | Status: AC

## 2016-05-31 NOTE — Patient Instructions (Signed)
You have been scheduled for a colonoscopy. Please follow written instructions given to you at your visit today.  Please pick up your prep supplies at the pharmacy within the next 1-3 days. If you use inhalers (even only as needed), please bring them with you on the day of your procedure. Your physician has requested that you go to www.startemmi.com and enter the access code given to you at your visit today. This web site gives a general overview about your procedure. However, you should still follow specific instructions given to you by our office regarding your preparation for the procedure.  Thank you for choosing me and [facility].  Malcolm T. Stark, Jr., MD., FACG  

## 2016-05-31 NOTE — Progress Notes (Addendum)
    History of Present Illness: This is a 55 year old female self referred for CRC screening and history of post op/post anesthesia nausea and vomiting. She had an appts for colonoscopy in 2014 however she cancelled due the unexpected death of her husband. She has had problems with post op/post anesthesia nausea and vomiting is very concerned about this potential side effect from anesthesia for colonoscopy. She wanted to discuss possibility of proceeding without anesthesia. She underwent EGD in 01/2006 with only Cetacaine spray was able to tolerate the procedure. Findings included mild GERD and mild gastritis. No family history of colon cancer or colon polyps. Denies weight loss, abdominal pain, constipation, diarrhea, change in stool caliber, melena, hematochezia, nausea, vomiting, dysphagia, reflux symptoms, chest pain.    Review of Systems: Pertinent positive and negative review of systems were noted in the above HPI section. All other review of systems were otherwise negative.  Current Medications, Allergies, Past Medical History, Past Surgical History, Family History and Social History were reviewed in [facility] electronic medical record.  Physical Exam: General: Well developed, well nourished, no acute distress Head: Normocephalic and atraumatic Eyes:  sclerae anicteric, EOMI Ears: Normal auditory acuity Mouth: No deformity or lesions Neck: Supple, no masses or thyromegaly Lungs: Clear throughout to auscultation Heart: Regular rate and rhythm; no murmurs, rubs or bruits Abdomen: Soft, non tender and non distended. No masses, hepatosplenomegaly or hernias noted. Normal Bowel sounds Rectal: deferred to colonoscopy Musculoskeletal: Symmetrical with no gross deformities  Skin: No lesions on visible extremities Pulses:  Normal pulses noted Extremities: No clubbing, cyanosis, edema or deformities noted Neurological: Alert oriented x 4, grossly nonfocal Cervical Nodes:  No significant  cervical adenopathy Inguinal Nodes: No significant inguinal adenopathy Psychological:  Alert and cooperative. Normal mood and affect  Assessment and Recommendations:   1. CRC screening, average risk. History of postanesthesia nausea and vomiting. Patient is reassured that propofol anesthesia rarely has this side effect. Advised that we would give Zofran intravenously prior to the procedure and Phenergan suppositories were prescribed for after the procedure if she has any difficulty at home. We discussed the possibility of colonoscopy without IV anesthesia and after discussion she preferred to proceed with MAC using intravenous propofol. The risks (including bleeding, perforation, infection, missed lesions, medication reactions and possible hospitalization or surgery if complications occur), benefits, and alternatives to colonoscopy with possible biopsy and possible polypectomy were discussed with the patient and they consent to proceed.

## 2016-06-30 ENCOUNTER — Encounter: Payer: Self-pay | Admitting: Gastroenterology

## 2016-07-11 ENCOUNTER — Telehealth: Payer: Self-pay | Admitting: Gastroenterology

## 2016-07-11 MED ORDER — PEG 3350-KCL-NABCB-NACL-NASULF 227.1 G PO SOLR: ORAL | 0 refills | Status: DC

## 2016-07-11 MED ORDER — PEG 3350-KCL-NABCB-NACL-NASULF 227.1 G PO PACK: PACK | ORAL | 0 refills | Status: DC

## 2016-07-11 NOTE — Telephone Encounter (Signed)
Left a message for patient to return my call. 

## 2016-07-11 NOTE — Telephone Encounter (Signed)
Patient states she wants the Golytely prep instead and will come by our office today to pick up the new instructions.

## 2016-07-11 NOTE — Telephone Encounter (Signed)
Spoke with patient and informed her we do not normally do PA for preps because insurance companies do not pay for them even when we do the PA's for them. Informed patient that there is an alternative to Suprep if she cannot afford the prep. Patient states she will call her pharmacy and find out which one she can afford and will call me back. Did inform patient that she will need new instructions for the Golytely prep if she decides to pick that one. Patient verbalized understanding.

## 2016-07-12 ENCOUNTER — Encounter: Payer: BLUE CROSS/BLUE SHIELD | Admitting: Gastroenterology

## 2016-07-13 ENCOUNTER — Ambulatory Visit (AMBULATORY_SURGERY_CENTER): Payer: BLUE CROSS/BLUE SHIELD | Admitting: Gastroenterology

## 2016-07-13 ENCOUNTER — Encounter: Payer: Self-pay | Admitting: Gastroenterology

## 2016-07-13 VITALS — BP 115/77 | HR 60 | Temp 98.2°F | Resp 16 | Ht 64.75 in | Wt 134.0 lb

## 2016-07-13 DIAGNOSIS — Z1212 Encounter for screening for malignant neoplasm of rectum: Secondary | ICD-10-CM

## 2016-07-13 DIAGNOSIS — Z1211 Encounter for screening for malignant neoplasm of colon: Secondary | ICD-10-CM | POA: Diagnosis present

## 2016-07-13 MED ORDER — SODIUM CHLORIDE 0.9 % IV SOLN: 500.0000 mL | INTRAVENOUS | Status: AC

## 2016-07-13 NOTE — Op Note (Signed)
[facility] Patient Name: Andrea Ho Procedure Date: 07/13/2016 7:55 AM MRN: [id number] Endoscopist: Meryl Dare , MD Age: 55 Referring MD:  Date of Birth: Feb 16, 1961 Gender: Female Account #: [id number] Procedure:                Colonoscopy Indications:              Screening for colorectal malignant neoplasm Medicines:                Monitored Anesthesia Care Procedure:                Pre-Anesthesia Assessment:                           - Prior to the procedure, a History and Physical                            was performed, and patient medications and                            allergies were reviewed. The patient's tolerance of                            previous anesthesia was also reviewed. The risks                            and benefits of the procedure and the sedation                            options and risks were discussed with the patient.                            All questions were answered, and informed consent                            was obtained. Prior Anticoagulants: The patient has                            taken no previous anticoagulant or antiplatelet                            agents. ASA Grade Assessment: II - A patient with                            mild systemic disease. After reviewing the risks                            and benefits, the patient was deemed in                            satisfactory condition to undergo the procedure.                           After obtaining informed consent, the colonoscope  was passed under direct vision. Throughout the                            procedure, the patient's blood pressure, pulse, and                            oxygen saturations were monitored continuously. The                            Model PCF-[id number]) scope was introduced                            through the anus and advanced to the the cecum,                            identified by  appendiceal orifice and ileocecal                            valve. The ileocecal valve, appendiceal orifice,                            and rectum were photographed. The quality of the                            bowel preparation was good. The colonoscopy was                            performed without difficulty. The patient tolerated                            the procedure well. Scope In: 8:43:51 AM Scope Out: 9:03:32 AM Scope Withdrawal Time: 0 hours 12 minutes 1 second  Total Procedure Duration: 0 hours 19 minutes 41 seconds  Findings:                 The perianal and digital rectal examinations were                            normal.                           The entire examined colon appeared normal on direct                            and retroflexion views. Complications:            No immediate complications. Estimated blood loss:                            None. Estimated Blood Loss:     Estimated blood loss: none. Impression:               - The entire examined colon is normal on direct and                            retroflexion views.                           -  No specimens collected. Recommendation:           - Repeat colonoscopy in 10 years for screening                            purposes.                           - Patient has a contact number available for                            emergencies. The signs and symptoms of potential                            delayed complications were discussed with the                            patient. Return to normal activities tomorrow.                            Written discharge instructions were provided to the                            patient.                           - Resume previous diet.                           - Continue present medications. Meryl Dare, MD 07/13/2016 9:10:20 AM This report has been signed electronically.

## 2016-07-13 NOTE — Progress Notes (Signed)
Pt states she only wants the "lightest possible sedation" d/t hx of nausea and vomiting after anesthesia.  Explained that Propofol is the drug used for sedation and that it has an anti-emetic in the medication.  She will speak with her CRNA before the procedure.

## 2016-07-13 NOTE — Progress Notes (Signed)
To PACU  Awake and spont resp. Report to RN

## 2016-07-13 NOTE — Patient Instructions (Signed)
YOU HAD AN ENDOSCOPIC PROCEDURE TODAY AT THE [facility]:   Refer to the procedure report that was given to you for any specific questions about what was found during the examination.  If the procedure report does not answer your questions, please call your gastroenterologist to clarify.  If you requested that your care partner not be given the details of your procedure findings, then the procedure report has been included in a sealed envelope for you to review at your convenience later.  YOU SHOULD EXPECT: Some feelings of bloating in the abdomen. Passage of more gas than usual.  Walking can help get rid of the air that was put into your GI tract during the procedure and reduce the bloating. If you had a lower endoscopy (such as a colonoscopy or flexible sigmoidoscopy) you may notice spotting of blood in your stool or on the toilet paper. If you underwent a bowel prep for your procedure, you may not have a normal bowel movement for a few days.  Please Note:  You might notice some irritation and congestion in your nose or some drainage.  This is from the oxygen used during your procedure.  There is no need for concern and it should clear up in a day or so.  SYMPTOMS TO REPORT IMMEDIATELY:   Following lower endoscopy (colonoscopy or flexible sigmoidoscopy):  Excessive amounts of blood in the stool  Significant tenderness or worsening of abdominal pains  Swelling of the abdomen that is new, acute  Fever of 100F or higher   For urgent or emergent issues, a gastroenterologist can be reached at any hour by calling [phone].   DIET:  We do recommend a small meal at first, but then you may proceed to your regular diet.  Drink plenty of fluids but you should avoid alcoholic beverages for 24 hours.  ACTIVITY:  You should plan to take it easy for the rest of today and you should NOT DRIVE or use heavy machinery until tomorrow (because of the sedation medicines used during the test).     FOLLOW UP: Our staff will call the number listed on your records the next business day following your procedure to check on you and address any questions or concerns that you may have regarding the information given to you following your procedure. If we do not reach you, we will leave a message.  However, if you are feeling well and you are not experiencing any problems, there is no need to return our call.  We will assume that you have returned to your regular daily activities without incident.  If any biopsies were taken you will be contacted by phone or by letter within the next 1-3 weeks.  Please call us at [phone] if you have not heard about the biopsies in 3 weeks.    SIGNATURES/CONFIDENTIALITY: You and/or your care partner have signed paperwork which will be entered into your electronic medical record.  These signatures attest to the fact that that the information above on your After Visit Summary has been reviewed and is understood.  Full responsibility of the confidentiality of this discharge information lies with you and/or your care-partner.   Resume medications.  

## 2016-07-14 ENCOUNTER — Telehealth: Payer: Self-pay | Admitting: *Deleted

## 2016-07-14 ENCOUNTER — Telehealth: Payer: Self-pay

## 2016-07-14 DIAGNOSIS — L570 Actinic keratosis: Secondary | ICD-10-CM | POA: Diagnosis not present

## 2016-07-14 DIAGNOSIS — L309 Dermatitis, unspecified: Secondary | ICD-10-CM | POA: Diagnosis not present

## 2016-07-14 DIAGNOSIS — L821 Other seborrheic keratosis: Secondary | ICD-10-CM | POA: Diagnosis not present

## 2016-07-14 NOTE — Telephone Encounter (Signed)
No answer message left to call back with questions and concerns. Will attempt to call back this afternoon. [email or web address] Rn

## 2016-07-14 NOTE — Telephone Encounter (Signed)
  Follow up Call-  Call back number 07/13/2016  Post procedure Call Back phone  # [phone]  Permission to leave phone message Yes  Some recent data might be hidden     Patient was called for follow up after her procedure on 07/13/2016. No answer at the number given for follow up phone call. A message was left on the answering machine.

## 2016-07-15 ENCOUNTER — Telehealth: Payer: Self-pay

## 2016-07-15 NOTE — Telephone Encounter (Signed)
  Follow up Call-  Call back number 07/13/2016  Post procedure Call Back phone  # [phone]  Permission to leave phone message Yes  Some recent data might be hidden     Patient questions:  Do you have a fever, pain , or abdominal swelling? No. Pain Score  0 *  Have you tolerated food without any problems? Yes.    Have you been able to return to your normal activities? Yes.    Do you have any questions about your discharge instructions: Diet   No. Medications  No. Follow up visit  No.  Do you have questions or concerns about your Care? No.  Actions: * If pain score is 4 or above: No action needed, pain <4.

## 2016-07-15 NOTE — Telephone Encounter (Signed)
Pt called back and said she is doing fine after her procedure

## 2016-08-16 DIAGNOSIS — L570 Actinic keratosis: Secondary | ICD-10-CM | POA: Diagnosis not present

## 2016-08-16 DIAGNOSIS — D1801 Hemangioma of skin and subcutaneous tissue: Secondary | ICD-10-CM | POA: Diagnosis not present

## 2016-09-07 ENCOUNTER — Other Ambulatory Visit (HOSPITAL_COMMUNITY)
Admission: RE | Admit: 2016-09-07 | Discharge: 2016-09-07 | Disposition: A | Discharge status: Home / Self Care | Payer: BLUE CROSS/BLUE SHIELD | Source: Ambulatory Visit | Attending: Obstetrics and Gynecology | Admitting: Obstetrics and Gynecology

## 2016-09-07 ENCOUNTER — Other Ambulatory Visit: Payer: Self-pay | Admitting: Obstetrics and Gynecology

## 2016-09-07 DIAGNOSIS — R87810 Cervical high risk human papillomavirus (HPV) DNA test positive: Secondary | ICD-10-CM | POA: Diagnosis not present

## 2016-09-07 DIAGNOSIS — Z01419 Encounter for gynecological examination (general) (routine) without abnormal findings: Secondary | ICD-10-CM | POA: Diagnosis not present

## 2016-09-07 DIAGNOSIS — N898 Other specified noninflammatory disorders of vagina: Secondary | ICD-10-CM | POA: Diagnosis not present

## 2016-09-07 DIAGNOSIS — Z113 Encounter for screening for infections with a predominantly sexual mode of transmission: Secondary | ICD-10-CM | POA: Diagnosis not present

## 2016-09-07 DIAGNOSIS — R102 Pelvic and perineal pain: Secondary | ICD-10-CM | POA: Diagnosis not present

## 2016-09-07 DIAGNOSIS — Z1151 Encounter for screening for human papillomavirus (HPV): Secondary | ICD-10-CM | POA: Diagnosis not present

## 2016-09-15 LAB — CYTOLOGY - PAP
Chlamydia: NEGATIVE
Diagnosis: NEGATIVE
HPV 16/18/45 genotyping: NEGATIVE
HPV: DETECTED — AB
Neisseria Gonorrhea: NEGATIVE

## 2016-09-29 DIAGNOSIS — R102 Pelvic and perineal pain: Secondary | ICD-10-CM | POA: Diagnosis not present

## 2016-10-04 ENCOUNTER — Other Ambulatory Visit: Payer: Self-pay | Admitting: Obstetrics and Gynecology

## 2016-10-04 DIAGNOSIS — Z1231 Encounter for screening mammogram for malignant neoplasm of breast: Secondary | ICD-10-CM

## 2016-10-31 ENCOUNTER — Ambulatory Visit
Admission: RE | Admit: 2016-10-31 | Discharge: 2016-10-31 | Disposition: A | Discharge status: Home / Self Care | Payer: BLUE CROSS/BLUE SHIELD | Source: Ambulatory Visit | Attending: Obstetrics and Gynecology | Admitting: Obstetrics and Gynecology

## 2016-10-31 DIAGNOSIS — Z1231 Encounter for screening mammogram for malignant neoplasm of breast: Secondary | ICD-10-CM | POA: Diagnosis not present

## 2017-01-26 DIAGNOSIS — E785 Hyperlipidemia, unspecified: Secondary | ICD-10-CM | POA: Diagnosis not present

## 2017-01-26 DIAGNOSIS — Z0000 Encounter for general adult medical examination without abnormal findings: Secondary | ICD-10-CM | POA: Diagnosis not present

## 2017-02-24 DIAGNOSIS — R5383 Other fatigue: Secondary | ICD-10-CM | POA: Diagnosis not present

## 2017-02-24 DIAGNOSIS — R002 Palpitations: Secondary | ICD-10-CM | POA: Diagnosis not present

## 2017-02-24 DIAGNOSIS — R0789 Other chest pain: Secondary | ICD-10-CM | POA: Diagnosis not present

## 2017-07-17 DIAGNOSIS — L820 Inflamed seborrheic keratosis: Secondary | ICD-10-CM | POA: Diagnosis not present

## 2017-07-17 DIAGNOSIS — R202 Paresthesia of skin: Secondary | ICD-10-CM | POA: Diagnosis not present

## 2017-07-17 DIAGNOSIS — L853 Xerosis cutis: Secondary | ICD-10-CM | POA: Diagnosis not present

## 2017-07-17 DIAGNOSIS — L821 Other seborrheic keratosis: Secondary | ICD-10-CM | POA: Diagnosis not present

## 2017-07-17 DIAGNOSIS — D225 Melanocytic nevi of trunk: Secondary | ICD-10-CM | POA: Diagnosis not present

## 2017-11-10 ENCOUNTER — Other Ambulatory Visit: Payer: Self-pay | Admitting: Obstetrics and Gynecology

## 2017-11-10 ENCOUNTER — Other Ambulatory Visit: Payer: Self-pay | Admitting: Family Medicine

## 2017-11-10 DIAGNOSIS — Z139 Encounter for screening, unspecified: Secondary | ICD-10-CM

## 2017-11-20 DIAGNOSIS — B349 Viral infection, unspecified: Secondary | ICD-10-CM | POA: Diagnosis not present

## 2017-11-20 DIAGNOSIS — J0100 Acute maxillary sinusitis, unspecified: Secondary | ICD-10-CM | POA: Diagnosis not present

## 2017-12-05 ENCOUNTER — Ambulatory Visit
Admission: RE | Admit: 2017-12-05 | Discharge: 2017-12-05 | Disposition: A | Discharge status: Home / Self Care | Payer: BLUE CROSS/BLUE SHIELD | Source: Ambulatory Visit | Attending: Family Medicine | Admitting: Family Medicine

## 2017-12-05 DIAGNOSIS — Z139 Encounter for screening, unspecified: Secondary | ICD-10-CM

## 2017-12-05 DIAGNOSIS — Z1231 Encounter for screening mammogram for malignant neoplasm of breast: Secondary | ICD-10-CM | POA: Diagnosis not present

## 2017-12-26 DIAGNOSIS — R509 Fever, unspecified: Secondary | ICD-10-CM | POA: Diagnosis not present

## 2017-12-26 DIAGNOSIS — B349 Viral infection, unspecified: Secondary | ICD-10-CM | POA: Diagnosis not present

## 2018-02-05 DIAGNOSIS — N952 Postmenopausal atrophic vaginitis: Secondary | ICD-10-CM | POA: Diagnosis not present

## 2018-02-05 DIAGNOSIS — G43009 Migraine without aura, not intractable, without status migrainosus: Secondary | ICD-10-CM | POA: Diagnosis not present

## 2018-02-05 DIAGNOSIS — E785 Hyperlipidemia, unspecified: Secondary | ICD-10-CM | POA: Diagnosis not present

## 2018-02-05 DIAGNOSIS — Z0000 Encounter for general adult medical examination without abnormal findings: Secondary | ICD-10-CM | POA: Diagnosis not present

## 2018-04-27 DIAGNOSIS — R1011 Right upper quadrant pain: Secondary | ICD-10-CM | POA: Diagnosis not present

## 2018-06-29 DIAGNOSIS — N6320 Unspecified lump in the left breast, unspecified quadrant: Secondary | ICD-10-CM | POA: Diagnosis not present

## 2018-06-29 DIAGNOSIS — R946 Abnormal results of thyroid function studies: Secondary | ICD-10-CM | POA: Diagnosis not present

## 2018-07-02 ENCOUNTER — Other Ambulatory Visit: Payer: Self-pay | Admitting: Family Medicine

## 2018-07-02 DIAGNOSIS — N6320 Unspecified lump in the left breast, unspecified quadrant: Secondary | ICD-10-CM

## 2018-07-04 ENCOUNTER — Ambulatory Visit
Admission: RE | Admit: 2018-07-04 | Discharge: 2018-07-04 | Disposition: A | Discharge status: Home / Self Care | Payer: BLUE CROSS/BLUE SHIELD | Source: Ambulatory Visit | Attending: Family Medicine | Admitting: Family Medicine

## 2018-07-04 ENCOUNTER — Other Ambulatory Visit: Payer: Self-pay | Admitting: Family Medicine

## 2018-07-04 DIAGNOSIS — R922 Inconclusive mammogram: Secondary | ICD-10-CM | POA: Diagnosis not present

## 2018-07-04 DIAGNOSIS — N6320 Unspecified lump in the left breast, unspecified quadrant: Secondary | ICD-10-CM

## 2018-07-04 DIAGNOSIS — N6489 Other specified disorders of breast: Secondary | ICD-10-CM | POA: Diagnosis not present

## 2018-07-05 DIAGNOSIS — L814 Other melanin hyperpigmentation: Secondary | ICD-10-CM | POA: Diagnosis not present

## 2018-07-05 DIAGNOSIS — D229 Melanocytic nevi, unspecified: Secondary | ICD-10-CM | POA: Diagnosis not present

## 2018-07-05 DIAGNOSIS — L821 Other seborrheic keratosis: Secondary | ICD-10-CM | POA: Diagnosis not present

## 2018-07-05 DIAGNOSIS — L570 Actinic keratosis: Secondary | ICD-10-CM | POA: Diagnosis not present

## 2018-07-05 DIAGNOSIS — D1801 Hemangioma of skin and subcutaneous tissue: Secondary | ICD-10-CM | POA: Diagnosis not present

## 2018-08-23 DIAGNOSIS — H9312 Tinnitus, left ear: Secondary | ICD-10-CM | POA: Diagnosis not present

## 2018-08-23 DIAGNOSIS — H903 Sensorineural hearing loss, bilateral: Secondary | ICD-10-CM | POA: Diagnosis not present

## 2018-08-30 ENCOUNTER — Other Ambulatory Visit: Payer: Self-pay | Admitting: Otolaryngology

## 2018-08-30 DIAGNOSIS — H9312 Tinnitus, left ear: Secondary | ICD-10-CM

## 2018-08-31 ENCOUNTER — Ambulatory Visit
Admission: RE | Admit: 2018-08-31 | Discharge: 2018-08-31 | Disposition: A | Discharge status: Home / Self Care | Payer: BLUE CROSS/BLUE SHIELD | Source: Ambulatory Visit | Attending: Otolaryngology | Admitting: Otolaryngology

## 2018-08-31 DIAGNOSIS — H9312 Tinnitus, left ear: Secondary | ICD-10-CM | POA: Diagnosis not present

## 2018-08-31 DIAGNOSIS — H9192 Unspecified hearing loss, left ear: Secondary | ICD-10-CM | POA: Diagnosis not present

## 2018-08-31 MED ORDER — GADOBENATE DIMEGLUMINE 529 MG/ML IV SOLN
12.0000 mL | Freq: Once | INTRAVENOUS | Status: AC | PRN
  Administered 2018-08-31: 12 mL via INTRAVENOUS

## 2018-09-02 ENCOUNTER — Other Ambulatory Visit: Payer: BLUE CROSS/BLUE SHIELD

## 2019-01-22 ENCOUNTER — Other Ambulatory Visit: Payer: Self-pay | Admitting: Family Medicine

## 2019-01-22 DIAGNOSIS — Z1231 Encounter for screening mammogram for malignant neoplasm of breast: Secondary | ICD-10-CM

## 2019-02-28 ENCOUNTER — Other Ambulatory Visit: Payer: Self-pay | Admitting: Family Medicine

## 2019-02-28 ENCOUNTER — Other Ambulatory Visit (HOSPITAL_COMMUNITY)
Admission: RE | Admit: 2019-02-28 | Discharge: 2019-02-28 | Disposition: A | Discharge status: Home / Self Care | Payer: BC Managed Care – PPO | Source: Ambulatory Visit | Attending: Family Medicine | Admitting: Family Medicine

## 2019-02-28 DIAGNOSIS — Z124 Encounter for screening for malignant neoplasm of cervix: Secondary | ICD-10-CM | POA: Diagnosis not present

## 2019-02-28 DIAGNOSIS — E7800 Pure hypercholesterolemia, unspecified: Secondary | ICD-10-CM | POA: Diagnosis not present

## 2019-02-28 DIAGNOSIS — Z8249 Family history of ischemic heart disease and other diseases of the circulatory system: Secondary | ICD-10-CM | POA: Diagnosis not present

## 2019-02-28 DIAGNOSIS — Z0000 Encounter for general adult medical examination without abnormal findings: Secondary | ICD-10-CM | POA: Diagnosis not present

## 2019-03-05 LAB — CYTOLOGY - PAP
Diagnosis: NEGATIVE
HPV: DETECTED — AB

## 2019-03-18 ENCOUNTER — Ambulatory Visit: Payer: BLUE CROSS/BLUE SHIELD

## 2019-04-26 ENCOUNTER — Ambulatory Visit: Payer: BC Managed Care – PPO

## 2019-05-14 DIAGNOSIS — D1801 Hemangioma of skin and subcutaneous tissue: Secondary | ICD-10-CM | POA: Diagnosis not present

## 2019-05-14 DIAGNOSIS — L821 Other seborrheic keratosis: Secondary | ICD-10-CM | POA: Diagnosis not present

## 2019-05-14 DIAGNOSIS — L814 Other melanin hyperpigmentation: Secondary | ICD-10-CM | POA: Diagnosis not present

## 2019-05-14 DIAGNOSIS — D225 Melanocytic nevi of trunk: Secondary | ICD-10-CM | POA: Diagnosis not present

## 2019-05-14 DIAGNOSIS — D485 Neoplasm of uncertain behavior of skin: Secondary | ICD-10-CM | POA: Diagnosis not present

## 2019-05-14 DIAGNOSIS — L820 Inflamed seborrheic keratosis: Secondary | ICD-10-CM | POA: Diagnosis not present

## 2019-05-26 DIAGNOSIS — Z20828 Contact with and (suspected) exposure to other viral communicable diseases: Secondary | ICD-10-CM | POA: Diagnosis not present

## 2019-07-01 ENCOUNTER — Ambulatory Visit
Admission: RE | Admit: 2019-07-01 | Discharge: 2019-07-01 | Disposition: A | Discharge status: Home / Self Care | Payer: BC Managed Care – PPO | Source: Ambulatory Visit | Attending: Family Medicine | Admitting: Family Medicine

## 2019-07-01 ENCOUNTER — Other Ambulatory Visit: Payer: Self-pay

## 2019-07-01 DIAGNOSIS — Z1231 Encounter for screening mammogram for malignant neoplasm of breast: Secondary | ICD-10-CM

## 2020-03-04 ENCOUNTER — Other Ambulatory Visit (HOSPITAL_COMMUNITY)
Admission: RE | Admit: 2020-03-04 | Discharge: 2020-03-04 | Disposition: A | Discharge status: Home / Self Care | Payer: BC Managed Care – PPO | Source: Ambulatory Visit | Attending: Family Medicine | Admitting: Family Medicine

## 2020-03-04 ENCOUNTER — Other Ambulatory Visit: Payer: Self-pay | Admitting: Family Medicine

## 2020-03-04 DIAGNOSIS — Z124 Encounter for screening for malignant neoplasm of cervix: Secondary | ICD-10-CM | POA: Insufficient documentation

## 2020-03-04 DIAGNOSIS — K219 Gastro-esophageal reflux disease without esophagitis: Secondary | ICD-10-CM | POA: Diagnosis not present

## 2020-03-04 DIAGNOSIS — Z0000 Encounter for general adult medical examination without abnormal findings: Secondary | ICD-10-CM | POA: Diagnosis not present

## 2020-03-04 DIAGNOSIS — R87618 Other abnormal cytological findings on specimens from cervix uteri: Secondary | ICD-10-CM | POA: Diagnosis not present

## 2020-03-04 DIAGNOSIS — E785 Hyperlipidemia, unspecified: Secondary | ICD-10-CM | POA: Diagnosis not present

## 2020-03-04 DIAGNOSIS — G43009 Migraine without aura, not intractable, without status migrainosus: Secondary | ICD-10-CM | POA: Diagnosis not present

## 2020-03-06 ENCOUNTER — Other Ambulatory Visit: Payer: Self-pay | Admitting: Family Medicine

## 2020-03-06 DIAGNOSIS — E2839 Other primary ovarian failure: Secondary | ICD-10-CM

## 2020-03-06 LAB — CYTOLOGY - PAP
Comment: NEGATIVE
High risk HPV: POSITIVE — AB

## 2020-04-01 ENCOUNTER — Other Ambulatory Visit: Payer: Self-pay | Admitting: Nurse Practitioner

## 2020-04-01 DIAGNOSIS — N888 Other specified noninflammatory disorders of cervix uteri: Secondary | ICD-10-CM | POA: Diagnosis not present

## 2020-04-01 DIAGNOSIS — N870 Mild cervical dysplasia: Secondary | ICD-10-CM | POA: Diagnosis not present

## 2020-04-01 DIAGNOSIS — R87619 Unspecified abnormal cytological findings in specimens from cervix uteri: Secondary | ICD-10-CM | POA: Diagnosis not present

## 2020-04-01 DIAGNOSIS — N898 Other specified noninflammatory disorders of vagina: Secondary | ICD-10-CM | POA: Diagnosis not present

## 2020-05-27 ENCOUNTER — Other Ambulatory Visit: Payer: Self-pay | Admitting: Family Medicine

## 2020-05-27 DIAGNOSIS — Z1231 Encounter for screening mammogram for malignant neoplasm of breast: Secondary | ICD-10-CM

## 2020-06-03 ENCOUNTER — Other Ambulatory Visit: Payer: BC Managed Care – PPO

## 2020-06-23 DIAGNOSIS — R0981 Nasal congestion: Secondary | ICD-10-CM | POA: Diagnosis not present

## 2020-06-23 DIAGNOSIS — Z20818 Contact with and (suspected) exposure to other bacterial communicable diseases: Secondary | ICD-10-CM | POA: Diagnosis not present

## 2020-06-23 DIAGNOSIS — R509 Fever, unspecified: Secondary | ICD-10-CM | POA: Diagnosis not present

## 2020-06-23 DIAGNOSIS — J019 Acute sinusitis, unspecified: Secondary | ICD-10-CM | POA: Diagnosis not present

## 2020-07-06 DIAGNOSIS — H903 Sensorineural hearing loss, bilateral: Secondary | ICD-10-CM | POA: Diagnosis not present

## 2020-07-22 ENCOUNTER — Ambulatory Visit
Admission: RE | Admit: 2020-07-22 | Discharge: 2020-07-22 | Disposition: A | Discharge status: Home / Self Care | Payer: BC Managed Care – PPO | Source: Ambulatory Visit | Attending: Family Medicine | Admitting: Family Medicine

## 2020-07-22 ENCOUNTER — Other Ambulatory Visit: Payer: Self-pay

## 2020-07-22 DIAGNOSIS — Z1231 Encounter for screening mammogram for malignant neoplasm of breast: Secondary | ICD-10-CM

## 2020-07-29 DIAGNOSIS — H903 Sensorineural hearing loss, bilateral: Secondary | ICD-10-CM | POA: Diagnosis not present

## 2020-07-29 DIAGNOSIS — S0502XA Injury of conjunctiva and corneal abrasion without foreign body, left eye, initial encounter: Secondary | ICD-10-CM | POA: Diagnosis not present

## 2020-08-24 DIAGNOSIS — L853 Xerosis cutis: Secondary | ICD-10-CM | POA: Diagnosis not present

## 2020-08-24 DIAGNOSIS — D225 Melanocytic nevi of trunk: Secondary | ICD-10-CM | POA: Diagnosis not present

## 2020-08-24 DIAGNOSIS — L814 Other melanin hyperpigmentation: Secondary | ICD-10-CM | POA: Diagnosis not present

## 2020-08-24 DIAGNOSIS — L821 Other seborrheic keratosis: Secondary | ICD-10-CM | POA: Diagnosis not present

## 2020-08-31 DIAGNOSIS — N950 Postmenopausal bleeding: Secondary | ICD-10-CM | POA: Diagnosis not present

## 2020-09-04 DIAGNOSIS — H903 Sensorineural hearing loss, bilateral: Secondary | ICD-10-CM | POA: Diagnosis not present

## 2020-10-21 DIAGNOSIS — D219 Benign neoplasm of connective and other soft tissue, unspecified: Secondary | ICD-10-CM | POA: Diagnosis not present

## 2020-10-21 DIAGNOSIS — R87612 Low grade squamous intraepithelial lesion on cytologic smear of cervix (LGSIL): Secondary | ICD-10-CM | POA: Diagnosis not present

## 2020-10-21 DIAGNOSIS — B977 Papillomavirus as the cause of diseases classified elsewhere: Secondary | ICD-10-CM | POA: Diagnosis not present

## 2020-10-21 DIAGNOSIS — N870 Mild cervical dysplasia: Secondary | ICD-10-CM | POA: Diagnosis not present

## 2020-10-21 DIAGNOSIS — R87610 Atypical squamous cells of undetermined significance on cytologic smear of cervix (ASC-US): Secondary | ICD-10-CM | POA: Diagnosis not present

## 2020-11-19 DIAGNOSIS — N72 Inflammatory disease of cervix uteri: Secondary | ICD-10-CM | POA: Diagnosis not present

## 2020-11-19 DIAGNOSIS — N888 Other specified noninflammatory disorders of cervix uteri: Secondary | ICD-10-CM | POA: Diagnosis not present

## 2020-11-19 DIAGNOSIS — R87610 Atypical squamous cells of undetermined significance on cytologic smear of cervix (ASC-US): Secondary | ICD-10-CM | POA: Diagnosis not present

## 2021-06-18 DIAGNOSIS — Z23 Encounter for immunization: Secondary | ICD-10-CM | POA: Diagnosis not present

## 2021-06-18 DIAGNOSIS — R3915 Urgency of urination: Secondary | ICD-10-CM | POA: Diagnosis not present

## 2021-06-18 DIAGNOSIS — G43909 Migraine, unspecified, not intractable, without status migrainosus: Secondary | ICD-10-CM | POA: Diagnosis not present

## 2021-06-18 DIAGNOSIS — Z0000 Encounter for general adult medical examination without abnormal findings: Secondary | ICD-10-CM | POA: Diagnosis not present

## 2021-06-18 DIAGNOSIS — R8290 Unspecified abnormal findings in urine: Secondary | ICD-10-CM | POA: Diagnosis not present

## 2021-06-18 DIAGNOSIS — E785 Hyperlipidemia, unspecified: Secondary | ICD-10-CM | POA: Diagnosis not present

## 2021-07-05 ENCOUNTER — Other Ambulatory Visit: Payer: Self-pay | Admitting: Family Medicine

## 2021-07-05 DIAGNOSIS — Z1231 Encounter for screening mammogram for malignant neoplasm of breast: Secondary | ICD-10-CM

## 2021-08-25 ENCOUNTER — Ambulatory Visit: Payer: BC Managed Care – PPO

## 2021-09-22 DIAGNOSIS — R0981 Nasal congestion: Secondary | ICD-10-CM | POA: Diagnosis not present

## 2021-09-22 DIAGNOSIS — J3489 Other specified disorders of nose and nasal sinuses: Secondary | ICD-10-CM | POA: Diagnosis not present

## 2021-09-22 DIAGNOSIS — J342 Deviated nasal septum: Secondary | ICD-10-CM | POA: Diagnosis not present

## 2021-09-23 ENCOUNTER — Ambulatory Visit
Admission: RE | Admit: 2021-09-23 | Discharge: 2021-09-23 | Disposition: A | Discharge status: Home / Self Care | Payer: BC Managed Care – PPO | Source: Ambulatory Visit | Attending: Family Medicine | Admitting: Family Medicine

## 2021-09-23 DIAGNOSIS — Z1231 Encounter for screening mammogram for malignant neoplasm of breast: Secondary | ICD-10-CM

## 2022-01-11 DIAGNOSIS — N898 Other specified noninflammatory disorders of vagina: Secondary | ICD-10-CM | POA: Diagnosis not present

## 2022-01-11 DIAGNOSIS — F5231 Female orgasmic disorder: Secondary | ICD-10-CM | POA: Diagnosis not present

## 2022-02-09 DIAGNOSIS — N952 Postmenopausal atrophic vaginitis: Secondary | ICD-10-CM | POA: Diagnosis not present

## 2022-02-09 DIAGNOSIS — N898 Other specified noninflammatory disorders of vagina: Secondary | ICD-10-CM | POA: Diagnosis not present

## 2022-02-11 DIAGNOSIS — Z20818 Contact with and (suspected) exposure to other bacterial communicable diseases: Secondary | ICD-10-CM | POA: Diagnosis not present

## 2022-02-11 DIAGNOSIS — J029 Acute pharyngitis, unspecified: Secondary | ICD-10-CM | POA: Diagnosis not present

## 2022-03-01 ENCOUNTER — Other Ambulatory Visit (HOSPITAL_COMMUNITY)
Admission: RE | Admit: 2022-03-01 | Discharge: 2022-03-01 | Disposition: A | Discharge status: Home / Self Care | Payer: BC Managed Care – PPO | Source: Ambulatory Visit | Attending: Obstetrics and Gynecology | Admitting: Obstetrics and Gynecology

## 2022-03-01 ENCOUNTER — Other Ambulatory Visit: Payer: Self-pay | Admitting: Obstetrics and Gynecology

## 2022-03-01 DIAGNOSIS — Z01419 Encounter for gynecological examination (general) (routine) without abnormal findings: Secondary | ICD-10-CM | POA: Insufficient documentation

## 2022-03-03 LAB — CYTOLOGY - PAP
Comment: NEGATIVE
Comment: NEGATIVE
Comment: NEGATIVE
HPV 16: NEGATIVE
HPV 18 / 45: NEGATIVE
High risk HPV: POSITIVE — AB

## 2022-04-07 ENCOUNTER — Other Ambulatory Visit: Payer: Self-pay | Admitting: Obstetrics and Gynecology

## 2022-04-07 DIAGNOSIS — N871 Moderate cervical dysplasia: Secondary | ICD-10-CM | POA: Diagnosis not present

## 2022-04-07 DIAGNOSIS — N870 Mild cervical dysplasia: Secondary | ICD-10-CM | POA: Diagnosis not present

## 2022-04-18 DIAGNOSIS — M9902 Segmental and somatic dysfunction of thoracic region: Secondary | ICD-10-CM | POA: Diagnosis not present

## 2022-04-18 DIAGNOSIS — M5386 Other specified dorsopathies, lumbar region: Secondary | ICD-10-CM | POA: Diagnosis not present

## 2022-04-18 DIAGNOSIS — M9905 Segmental and somatic dysfunction of pelvic region: Secondary | ICD-10-CM | POA: Diagnosis not present

## 2022-04-18 DIAGNOSIS — M9903 Segmental and somatic dysfunction of lumbar region: Secondary | ICD-10-CM | POA: Diagnosis not present

## 2022-04-20 DIAGNOSIS — N871 Moderate cervical dysplasia: Secondary | ICD-10-CM | POA: Diagnosis not present

## 2022-04-21 DIAGNOSIS — M5386 Other specified dorsopathies, lumbar region: Secondary | ICD-10-CM | POA: Diagnosis not present

## 2022-04-21 DIAGNOSIS — M9905 Segmental and somatic dysfunction of pelvic region: Secondary | ICD-10-CM | POA: Diagnosis not present

## 2022-04-21 DIAGNOSIS — M9902 Segmental and somatic dysfunction of thoracic region: Secondary | ICD-10-CM | POA: Diagnosis not present

## 2022-04-21 DIAGNOSIS — M9903 Segmental and somatic dysfunction of lumbar region: Secondary | ICD-10-CM | POA: Diagnosis not present

## 2022-05-12 IMAGING — MG DIGITAL SCREENING BREAST BILAT IMPLANT W/ TOMO W/ CAD
8 of 13 series · 8 of 29 positions shown · non-contrast
Comparison: Previous exam(s).

CLINICAL DATA: Screening.

EXAM:
DIGITAL SCREENING BILATERAL MAMMOGRAM WITH IMPLANTS, CAD AND
TOMOSYNTHESIS
TECHNIQUE: Bilateral screening digital craniocaudal and mediolateral oblique
mammograms were obtained. Bilateral screening digital breast
tomosynthesis was performed. The images were evaluated with
computer-aided detection. Standard and/or implant displaced views
were performed.

[R CC]
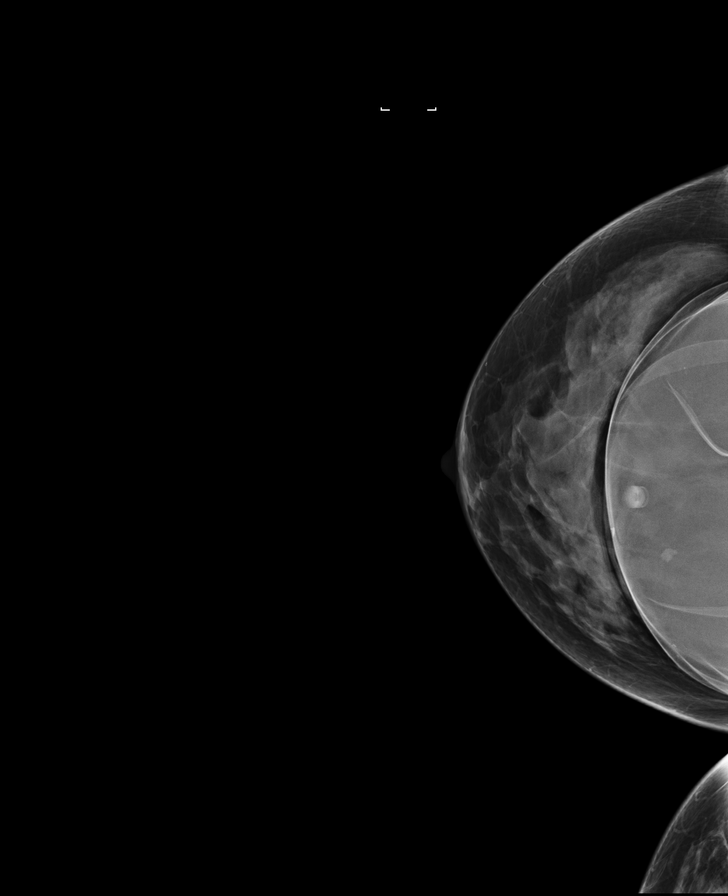

[L CC (1 of 2)]
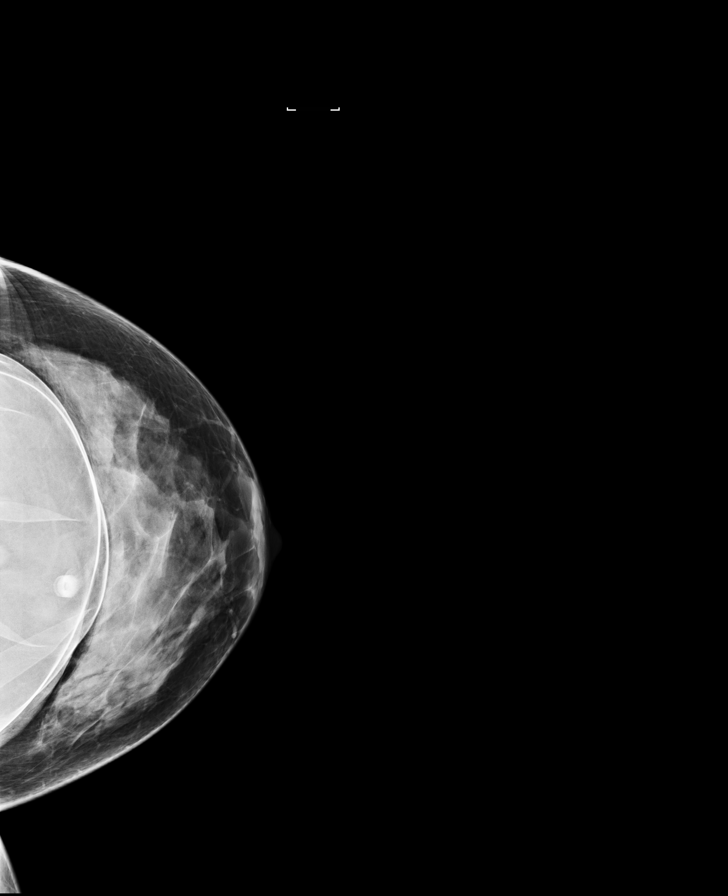

[L CC (2 of 2)]
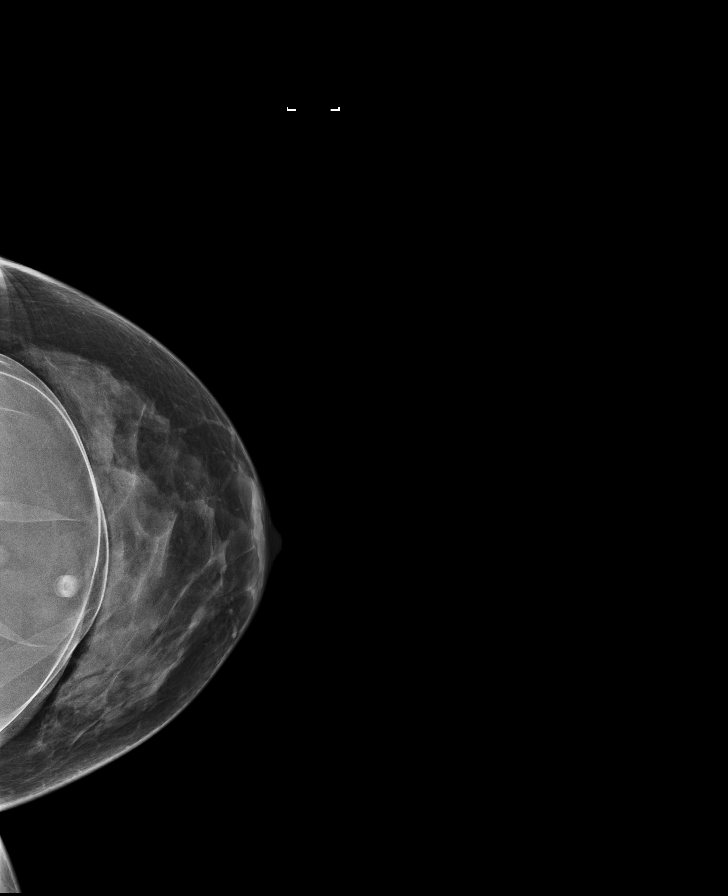

[L MLO]
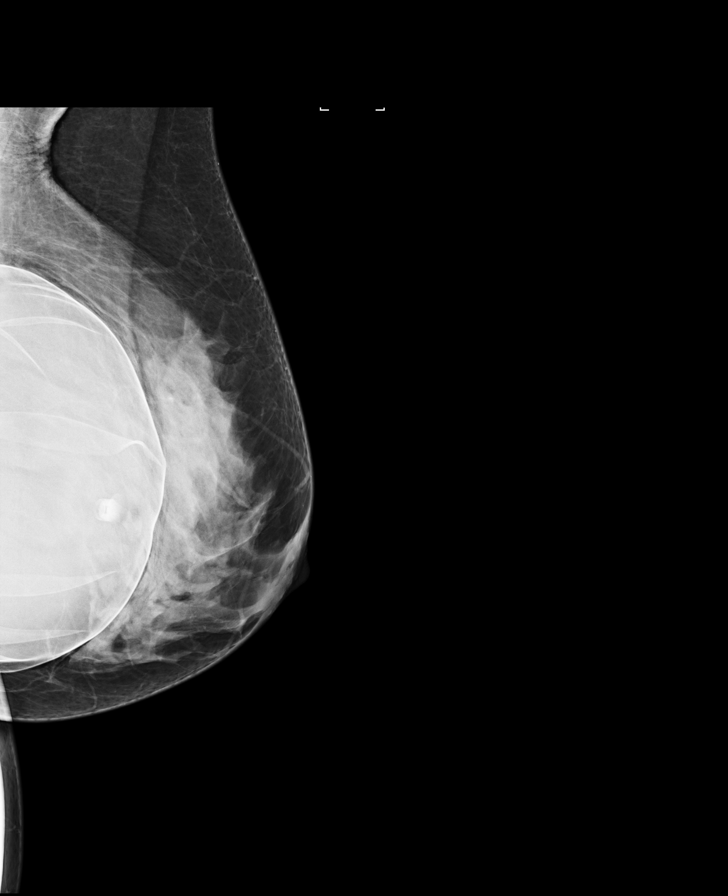

[R MLO]
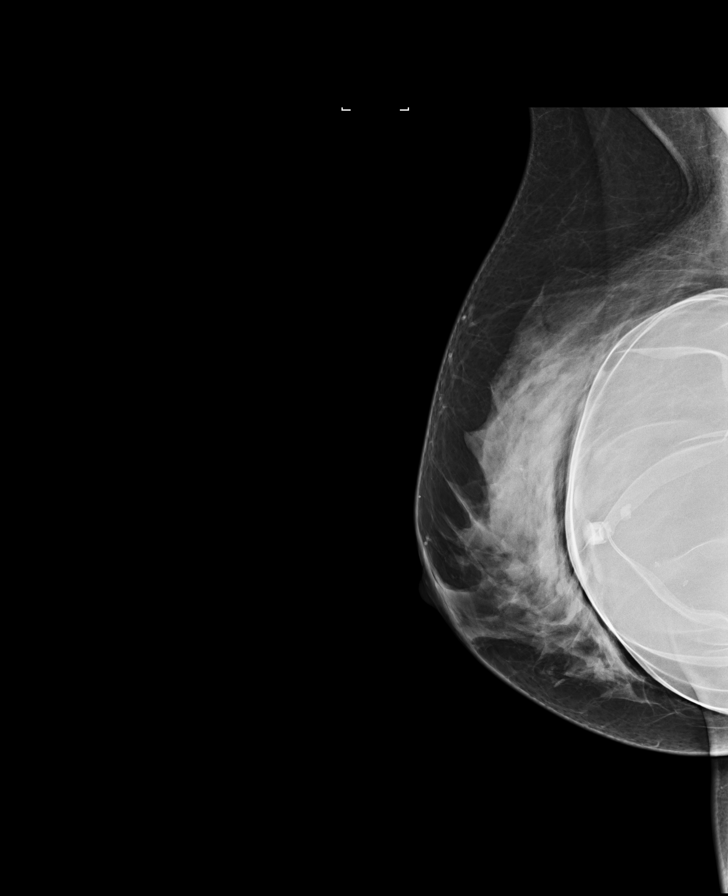

[L CC synth-2D]
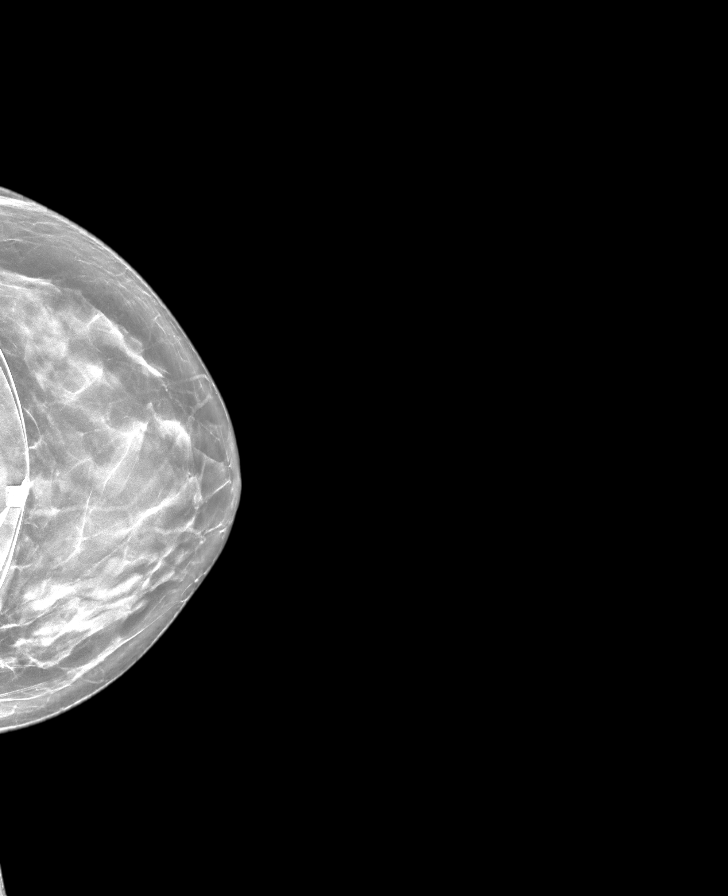

[R CC synth-2D]
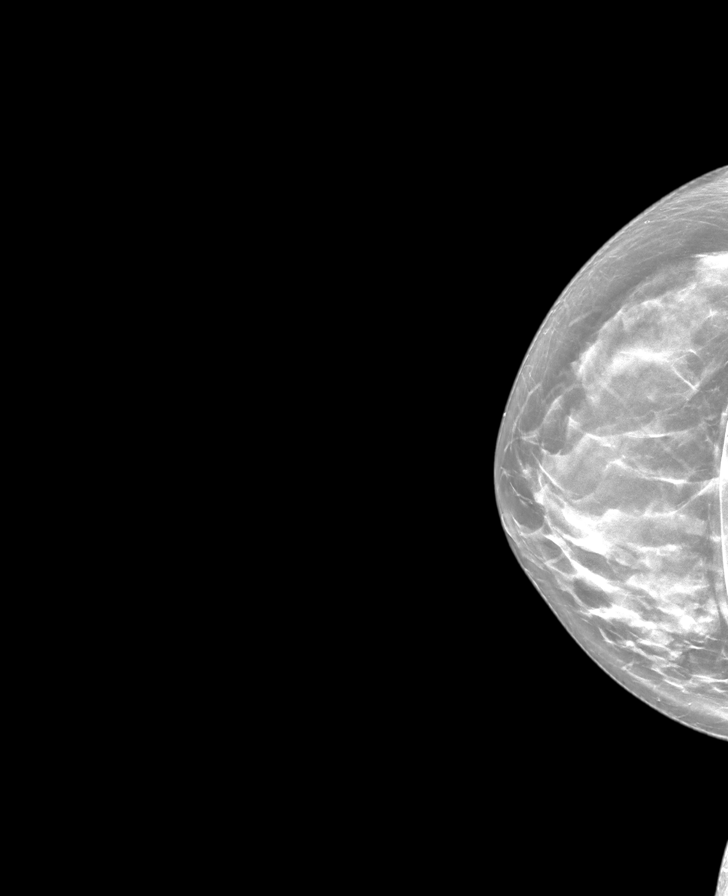

[R MLO synth-2D]
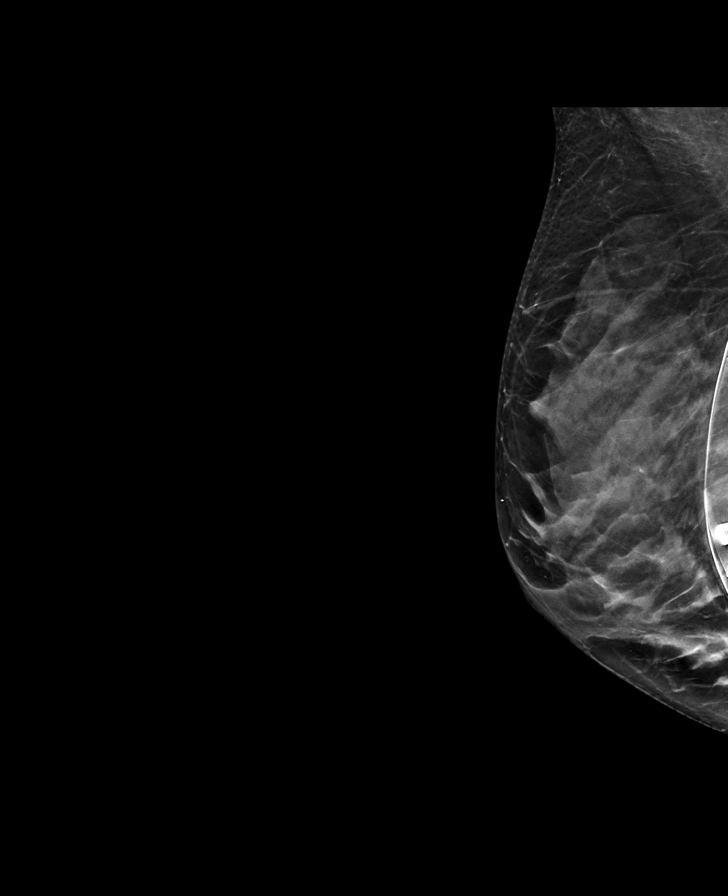

[8 of 29 positions shown; findings below may reference images not displayed]

ACR Breast Density Category d: The breast tissue is extremely dense,
which lowers the sensitivity of mammography.
FINDINGS: The patient has retropectoral implants. There are no findings
suspicious for malignancy.
IMPRESSION: No mammographic evidence of malignancy. A result letter of this
screening mammogram will be mailed directly to the patient.

RECOMMENDATION:
Screening mammogram in one year. (Code:GG-0-M5E)

BI-RADS CATEGORY  1:  Negative.

## 2022-05-26 ENCOUNTER — Other Ambulatory Visit: Payer: Self-pay | Admitting: Obstetrics and Gynecology

## 2022-05-26 DIAGNOSIS — N871 Moderate cervical dysplasia: Secondary | ICD-10-CM | POA: Diagnosis not present

## 2022-06-10 DIAGNOSIS — H9113 Presbycusis, bilateral: Secondary | ICD-10-CM | POA: Diagnosis not present

## 2022-06-10 DIAGNOSIS — J342 Deviated nasal septum: Secondary | ICD-10-CM | POA: Diagnosis not present

## 2022-06-29 DIAGNOSIS — Z13220 Encounter for screening for lipoid disorders: Secondary | ICD-10-CM | POA: Diagnosis not present

## 2022-06-29 DIAGNOSIS — Z0000 Encounter for general adult medical examination without abnormal findings: Secondary | ICD-10-CM | POA: Diagnosis not present

## 2022-06-29 DIAGNOSIS — Z23 Encounter for immunization: Secondary | ICD-10-CM | POA: Diagnosis not present

## 2022-06-30 DIAGNOSIS — N871 Moderate cervical dysplasia: Secondary | ICD-10-CM | POA: Diagnosis not present

## 2022-07-25 ENCOUNTER — Other Ambulatory Visit (HOSPITAL_BASED_OUTPATIENT_CLINIC_OR_DEPARTMENT_OTHER): Payer: Self-pay | Admitting: Family Medicine

## 2022-07-25 DIAGNOSIS — E785 Hyperlipidemia, unspecified: Secondary | ICD-10-CM

## 2022-08-25 ENCOUNTER — Ambulatory Visit (HOSPITAL_BASED_OUTPATIENT_CLINIC_OR_DEPARTMENT_OTHER)
Admission: RE | Admit: 2022-08-25 | Discharge: 2022-08-25 | Disposition: A | Discharge status: Home / Self Care | Payer: BC Managed Care – PPO | Source: Ambulatory Visit | Attending: Family Medicine | Admitting: Family Medicine

## 2022-08-25 DIAGNOSIS — E785 Hyperlipidemia, unspecified: Secondary | ICD-10-CM

## 2022-09-21 DIAGNOSIS — L821 Other seborrheic keratosis: Secondary | ICD-10-CM | POA: Diagnosis not present

## 2022-09-21 DIAGNOSIS — L578 Other skin changes due to chronic exposure to nonionizing radiation: Secondary | ICD-10-CM | POA: Diagnosis not present

## 2022-09-21 DIAGNOSIS — L570 Actinic keratosis: Secondary | ICD-10-CM | POA: Diagnosis not present

## 2022-09-21 DIAGNOSIS — L448 Other specified papulosquamous disorders: Secondary | ICD-10-CM | POA: Diagnosis not present

## 2022-09-21 DIAGNOSIS — L814 Other melanin hyperpigmentation: Secondary | ICD-10-CM | POA: Diagnosis not present

## 2022-09-21 DIAGNOSIS — D225 Melanocytic nevi of trunk: Secondary | ICD-10-CM | POA: Diagnosis not present

## 2022-09-22 ENCOUNTER — Other Ambulatory Visit: Payer: Self-pay | Admitting: Family Medicine

## 2022-09-22 DIAGNOSIS — Z1231 Encounter for screening mammogram for malignant neoplasm of breast: Secondary | ICD-10-CM

## 2022-10-07 DIAGNOSIS — G43009 Migraine without aura, not intractable, without status migrainosus: Secondary | ICD-10-CM | POA: Diagnosis not present

## 2022-10-07 DIAGNOSIS — R202 Paresthesia of skin: Secondary | ICD-10-CM | POA: Diagnosis not present

## 2022-10-07 DIAGNOSIS — M5441 Lumbago with sciatica, right side: Secondary | ICD-10-CM | POA: Diagnosis not present

## 2022-10-07 DIAGNOSIS — R1013 Epigastric pain: Secondary | ICD-10-CM | POA: Diagnosis not present

## 2022-10-21 DIAGNOSIS — M5451 Vertebrogenic low back pain: Secondary | ICD-10-CM | POA: Diagnosis not present

## 2022-11-16 ENCOUNTER — Ambulatory Visit: Payer: BC Managed Care – PPO

## 2022-11-28 ENCOUNTER — Other Ambulatory Visit: Payer: Self-pay | Admitting: Obstetrics and Gynecology

## 2022-11-28 ENCOUNTER — Other Ambulatory Visit (HOSPITAL_COMMUNITY)
Admission: RE | Admit: 2022-11-28 | Discharge: 2022-11-28 | Disposition: A | Discharge status: Home / Self Care | Payer: BC Managed Care – PPO | Source: Ambulatory Visit | Attending: Obstetrics and Gynecology | Admitting: Obstetrics and Gynecology

## 2022-11-28 DIAGNOSIS — N871 Moderate cervical dysplasia: Secondary | ICD-10-CM | POA: Diagnosis not present

## 2022-12-02 LAB — CYTOLOGY - PAP
Comment: NEGATIVE
Diagnosis: NEGATIVE
Diagnosis: REACTIVE
High risk HPV: NEGATIVE

## 2022-12-14 DIAGNOSIS — M9905 Segmental and somatic dysfunction of pelvic region: Secondary | ICD-10-CM | POA: Diagnosis not present

## 2022-12-14 DIAGNOSIS — M9902 Segmental and somatic dysfunction of thoracic region: Secondary | ICD-10-CM | POA: Diagnosis not present

## 2022-12-14 DIAGNOSIS — M9903 Segmental and somatic dysfunction of lumbar region: Secondary | ICD-10-CM | POA: Diagnosis not present

## 2022-12-14 DIAGNOSIS — M4135 Thoracogenic scoliosis, thoracolumbar region: Secondary | ICD-10-CM | POA: Diagnosis not present

## 2022-12-14 DIAGNOSIS — M5386 Other specified dorsopathies, lumbar region: Secondary | ICD-10-CM | POA: Diagnosis not present

## 2022-12-20 DIAGNOSIS — M9905 Segmental and somatic dysfunction of pelvic region: Secondary | ICD-10-CM | POA: Diagnosis not present

## 2022-12-20 DIAGNOSIS — M4135 Thoracogenic scoliosis, thoracolumbar region: Secondary | ICD-10-CM | POA: Diagnosis not present

## 2022-12-20 DIAGNOSIS — M9903 Segmental and somatic dysfunction of lumbar region: Secondary | ICD-10-CM | POA: Diagnosis not present

## 2022-12-20 DIAGNOSIS — M5386 Other specified dorsopathies, lumbar region: Secondary | ICD-10-CM | POA: Diagnosis not present

## 2022-12-27 DIAGNOSIS — M9905 Segmental and somatic dysfunction of pelvic region: Secondary | ICD-10-CM | POA: Diagnosis not present

## 2022-12-27 DIAGNOSIS — M9903 Segmental and somatic dysfunction of lumbar region: Secondary | ICD-10-CM | POA: Diagnosis not present

## 2022-12-27 DIAGNOSIS — M4135 Thoracogenic scoliosis, thoracolumbar region: Secondary | ICD-10-CM | POA: Diagnosis not present

## 2022-12-27 DIAGNOSIS — M5386 Other specified dorsopathies, lumbar region: Secondary | ICD-10-CM | POA: Diagnosis not present

## 2022-12-28 ENCOUNTER — Ambulatory Visit
Admission: RE | Admit: 2022-12-28 | Discharge: 2022-12-28 | Disposition: A | Discharge status: Home / Self Care | Payer: BC Managed Care – PPO | Source: Ambulatory Visit | Attending: Family Medicine | Admitting: Family Medicine

## 2022-12-28 DIAGNOSIS — Z1231 Encounter for screening mammogram for malignant neoplasm of breast: Secondary | ICD-10-CM

## 2023-01-02 DIAGNOSIS — G479 Sleep disorder, unspecified: Secondary | ICD-10-CM | POA: Diagnosis not present

## 2023-01-03 DIAGNOSIS — M4135 Thoracogenic scoliosis, thoracolumbar region: Secondary | ICD-10-CM | POA: Diagnosis not present

## 2023-01-03 DIAGNOSIS — M9905 Segmental and somatic dysfunction of pelvic region: Secondary | ICD-10-CM | POA: Diagnosis not present

## 2023-01-03 DIAGNOSIS — M9903 Segmental and somatic dysfunction of lumbar region: Secondary | ICD-10-CM | POA: Diagnosis not present

## 2023-01-03 DIAGNOSIS — M5386 Other specified dorsopathies, lumbar region: Secondary | ICD-10-CM | POA: Diagnosis not present

## 2023-01-10 DIAGNOSIS — M5386 Other specified dorsopathies, lumbar region: Secondary | ICD-10-CM | POA: Diagnosis not present

## 2023-01-10 DIAGNOSIS — M4135 Thoracogenic scoliosis, thoracolumbar region: Secondary | ICD-10-CM | POA: Diagnosis not present

## 2023-01-10 DIAGNOSIS — M9905 Segmental and somatic dysfunction of pelvic region: Secondary | ICD-10-CM | POA: Diagnosis not present

## 2023-01-10 DIAGNOSIS — M9903 Segmental and somatic dysfunction of lumbar region: Secondary | ICD-10-CM | POA: Diagnosis not present

## 2023-02-08 ENCOUNTER — Other Ambulatory Visit: Payer: Self-pay | Admitting: *Deleted

## 2023-02-08 ENCOUNTER — Encounter (HOSPITAL_BASED_OUTPATIENT_CLINIC_OR_DEPARTMENT_OTHER): Payer: Self-pay

## 2023-02-08 ENCOUNTER — Other Ambulatory Visit: Payer: Self-pay | Admitting: Physician Assistant

## 2023-02-08 ENCOUNTER — Ambulatory Visit
Admission: RE | Admit: 2023-02-08 | Discharge: 2023-02-08 | Disposition: A | Discharge status: Home / Self Care | Payer: BC Managed Care – PPO | Source: Ambulatory Visit | Attending: Physician Assistant | Admitting: Physician Assistant

## 2023-02-08 ENCOUNTER — Other Ambulatory Visit: Payer: Self-pay

## 2023-02-08 ENCOUNTER — Emergency Department (HOSPITAL_BASED_OUTPATIENT_CLINIC_OR_DEPARTMENT_OTHER): Payer: BC Managed Care – PPO

## 2023-02-08 ENCOUNTER — Emergency Department (HOSPITAL_BASED_OUTPATIENT_CLINIC_OR_DEPARTMENT_OTHER)
Admission: EM | Admit: 2023-02-08 | Discharge: 2023-02-08 | Disposition: A | Discharge status: Home / Self Care | Payer: BC Managed Care – PPO | Attending: Emergency Medicine | Admitting: Emergency Medicine

## 2023-02-08 ENCOUNTER — Ambulatory Visit: Payer: BC Managed Care – PPO | Attending: Physician Assistant

## 2023-02-08 DIAGNOSIS — R002 Palpitations: Secondary | ICD-10-CM | POA: Diagnosis not present

## 2023-02-08 DIAGNOSIS — R7989 Other specified abnormal findings of blood chemistry: Secondary | ICD-10-CM | POA: Diagnosis not present

## 2023-02-08 DIAGNOSIS — R0609 Other forms of dyspnea: Secondary | ICD-10-CM | POA: Diagnosis not present

## 2023-02-08 LAB — CBC WITH DIFFERENTIAL/PLATELET
Abs Immature Granulocytes: 0.01 10*3/uL (ref 0.00–0.07)
Basophils Absolute: 0.1 10*3/uL (ref 0.0–0.1)
Basophils Relative: 1 %
Eosinophils Absolute: 0.2 10*3/uL (ref 0.0–0.5)
Eosinophils Relative: 3 %
HCT: 46.2 % — ABNORMAL HIGH (ref 36.0–46.0)
Hemoglobin: 14.7 g/dL (ref 12.0–15.0)
Immature Granulocytes: 0 %
Lymphocytes Relative: 33 %
Lymphs Abs: 2.4 10*3/uL (ref 0.7–4.0)
MCH: 30.4 pg (ref 26.0–34.0)
MCHC: 31.8 g/dL (ref 30.0–36.0)
MCV: 95.5 fL (ref 80.0–100.0)
Monocytes Absolute: 0.6 10*3/uL (ref 0.1–1.0)
Monocytes Relative: 8 %
Neutro Abs: 4 10*3/uL (ref 1.7–7.7)
Neutrophils Relative %: 55 %
Platelets: 214 10*3/uL (ref 150–400)
RBC: 4.84 MIL/uL (ref 3.87–5.11)
RDW: 12.8 % (ref 11.5–15.5)
WBC: 7.4 10*3/uL (ref 4.0–10.5)
nRBC: 0 % (ref 0.0–0.2)

## 2023-02-08 LAB — T4, FREE: Free T4: 0.61 ng/dL (ref 0.61–1.12)

## 2023-02-08 LAB — BASIC METABOLIC PANEL
Anion gap: 8 (ref 5–15)
BUN: 17 mg/dL (ref 8–23)
CO2: 27 mmol/L (ref 22–32)
Calcium: 9.4 mg/dL (ref 8.9–10.3)
Chloride: 101 mmol/L (ref 98–111)
Creatinine, Ser: 1.02 mg/dL — ABNORMAL HIGH (ref 0.44–1.00)
GFR, Estimated: >60 mL/min (ref >60)
Glucose, Bld: 108 mg/dL — ABNORMAL HIGH (ref 70–99)
Potassium: 3.9 mmol/L (ref 3.5–5.1)
Sodium: 136 mmol/L (ref 135–145)

## 2023-02-08 LAB — TSH: TSH: 11.891 u[IU]/mL — ABNORMAL HIGH (ref 0.350–4.500)

## 2023-02-08 LAB — MAGNESIUM: Magnesium: 2 mg/dL (ref 1.7–2.4)

## 2023-02-08 MED ORDER — IOHEXOL 350 MG/ML SOLN
75.0000 mL | Freq: Once | INTRAVENOUS | Status: AC | PRN
  Administered 2023-02-08: 75 mL via INTRAVENOUS

## 2023-02-08 NOTE — Discharge Instructions (Signed)
Thank you for coming to [facility]. You were seen for palpitations. We did an exam, labs, and imaging, and these showed no acute findings.  Please continue to workup as an outpatient including discussing the results of your echocardiogram as well as obtaining a Holter monitor as recommended by your primary care physician.  Please stay well-hydrated at home.  Please follow up with your primary care provider within 1-2 weeks.   Do not hesitate to return to the ED or call 911 if you experience: -Worsening symptoms -Chest pain, shortness of breath -Lightheadedness, passing out -Fevers/chills -Anything else that concerns you

## 2023-02-08 NOTE — Progress Notes (Unsigned)
Patient given [id number] ZIO XT from office inventory to apply after CT scan.

## 2023-02-08 NOTE — ED Triage Notes (Signed)
Pt reports she feels like she has had an irregular heart beat and can feel pulsations in her neck intermittently x 3 weeks. She was sent here by her doctor to have a CTA due to elevated D-dimer. Denies chest pain but she does report "off and on" shortness of breath. She does not take blood thinners.

## 2023-02-08 NOTE — ED Provider Notes (Signed)
[place] [facility] Provider Note   CSN: [id number] Arrival date & time: 02/08/23  1545     History  Chief Complaint  Patient presents with   Abnormal Lab    Andrea Ho is a 62 y.o. female with unremarkable PMH who presents with palpitations.   Per chart review patient was seen today by PCP for palpitations occurring 3-4 times a day that started 2 to 3 weeks ago, increasing in frequency.  Also having a pulsating sensation above her collarbone, L>R, that is going on x 1 week and is intermittent.  Symptoms not exertional.  She does note that sometimes she feels as if she gets lightheaded with standing and is having some dyspnea on exertion climbing the stairs to her apartment which is 2 flights of stairs, not normally short of breath with this.  In the clinic, EKG demonstrated sinus bradycardia but otherwise no acute findings.  D-dimer was elevated and so sent to emergency department for CT PE. She had both a chest x-ray and on echocardiogram this afternoon but does not know the results of either one.  Went to receive the Holter monitor which had just been placed when she received a call to come to the emergency department.   Denies chest pain but she does report "off and on" shortness of breath. She does not take blood thinners.  Mother had multiple MIs, dad has hypertension.  Has a congenital single left kidney.  Denies any fever/chills, cough, hemoptysis, hormone use, lower extremity edema, recent hospitalization/immobilization/surgeries, recent travel/car trips, history of DVT/PE. She is concenred that the pulsing sensation in her collarbone is a subclavian aneurysm.    HPI     Home Medications Prior to Admission medications   Medication Sig Start Date End Date Taking? Authorizing Provider  calcium carbonate (OS-CAL) 600 MG TABS Take 600 mg by mouth 2 (two) times daily with a meal.      [provider]  diphenhydrAMINE (BENADRYL)  25 MG tablet Take 25-50 mg by mouth at bedtime as needed.    [provider]  promethazine (PHENERGAN) 25 MG suppository Use as directed for Colonoscopy Patient not taking: Reported on 07/13/2016 05/31/16   Meryl Dare, MD  Promethazine HCl (PHENERGAN PO) Take by mouth as needed.    [provider]  rizatriptan (MAXALT) 10 MG tablet Take 10 mg by mouth as needed. May repeat in 2 hours if needed     [provider]      Allergies    Bactrim, Codeine, and Other    Review of Systems   Review of Systems Review of systems Negative for f/c, cough, hemoptysis.  A 10 point review of systems was performed and is negative unless otherwise reported in HPI.  Physical Exam Updated Vital Signs BP (!) 156/101 (BP Location: Left Arm)   Pulse 75   Temp 98 F (36.7 C) (Oral)   Resp 18   Ht 5\' 5"  (1.651 m)   Wt 59 kg   LMP 06/17/2010   SpO2 97%   BMI 21.63 kg/m  Physical Exam General: Normal appearing female, lying in bed.  HEENT: Sclera anicteric, MMM, trachea midline.  Cardiology: RRR, no murmurs/rubs/gallops. BL radial and DP pulses equal bilaterally.  No pulsating masses, abnormalities noted on palpation of the neck or collarbone region. Resp: Normal respiratory rate and effort. CTAB, no wheezes, rhonchi, crackles.  Abd: Soft, non-tender, non-distended. No rebound tenderness or guarding.  GU: Deferred. MSK: No peripheral  edema or signs of trauma. Extremities without deformity or TTP. No cyanosis or clubbing. Skin: warm, dry.  Neuro: A&Ox4, CNs II-XII grossly intact. MAEs. Sensation grossly intact.  Psych: Normal mood and affect.   ED Results / Procedures / Treatments   Labs (all labs ordered are listed, but only abnormal results are displayed) Labs Reviewed  BASIC METABOLIC PANEL - Abnormal; Notable for the following components:      Result Value   Glucose, Bld 108 (*)    Creatinine, Ser 1.02 (*)    All other components within normal limits  CBC WITH  DIFFERENTIAL/PLATELET - Abnormal; Notable for the following components:   HCT 46.2 (*)    All other components within normal limits  MAGNESIUM  TSH  T4, FREE    EKG EKG Interpretation  Date/Time:  Wednesday Feb 08 2023 15:57:57 EDT Ventricular Rate:  77 PR Interval:  154 QRS Duration: 91 QT Interval:  377 QTC Calculation: 427 R Axis:   25 Text Interpretation: Sinus rhythm Confirmed by Vivi Barrack [phone]) on 02/08/2023 4:28:49 PM  Radiology CT Angio Chest PE W and/or Wo Contrast  Result Date: 02/08/2023 CLINICAL DATA:  Elevated D-dimer level, dyspnea EXAM: CT ANGIOGRAPHY CHEST WITH CONTRAST TECHNIQUE: Multidetector CT imaging of the chest was performed using the standard protocol during bolus administration of intravenous contrast. Multiplanar CT image reconstructions and MIPs were obtained to evaluate the vascular anatomy. RADIATION DOSE REDUCTION: This exam was performed according to the departmental dose-optimization program which includes automated exposure control, adjustment of the mA and/or kV according to patient size and/or use of iterative reconstruction technique. CONTRAST:  75mL OMNIPAQUE IOHEXOL 350 MG/ML SOLN COMPARISON:  Overlapping portion cardiac CT 08/25/2022; chest radiograph 02/08/2023 FINDINGS: Cardiovascular: No filling defect is identified in the pulmonary arterial tree to suggest pulmonary embolus. Mediastinum/Nodes: Unremarkable Lungs/Pleura: Scattered small nodules in the lungs all less than 5 mm in average diameter. In the portion of the lung that overlaps the cardiac CT from 08/25/2022, all of these nodules are stable. Some of the small nodules in the upper lobes were not included on the cardiac CT. Upper Abdomen: Unremarkable Musculoskeletal: Bilateral breast implants. Mild thoracic spondylosis. Mild levoconvex thoracic scoliosis. Review of the MIP images confirms the above findings. IMPRESSION: 1. No filling defect is identified in the pulmonary arterial tree to  suggest pulmonary embolus. 2. Scattered small nodules in the lungs all less than 5 mm in average diameter. In the portion of the lung that overlaps the cardiac CT from 08/25/2022, all of these nodules are stable. Some of the small nodules in the upper lobes have not been imaged in the past. No follow-up needed if patient is low-risk (and has no known or suspected primary neoplasm). Non-contrast chest CT can be considered in 12 months if patient is high-risk. This recommendation follows the consensus statement: Guidelines for Management of Incidental Pulmonary Nodules Detected on CT Images: From the Fleischner Society 2017; Radiology 2017; 284:228-243. 3. Mild thoracic spondylosis and mild levoconvex thoracic scoliosis. Electronically Signed   By: Gaylyn Rong M.D.   On: 02/08/2023 17:17   DG Chest 2 View  Result Date: 02/08/2023 CLINICAL DATA:  Dyspnea on exertion. EXAM: CHEST - 2 VIEW COMPARISON:  Cardiac CT 08/25/2022 FINDINGS: Cardiac silhouette and mediastinal contours are within normal limits. There is flattening of the diaphragms and mild-to-moderate hyperinflation. The lungs are clear. No pleural effusion or pneumothorax. Moderate multilevel degenerative disc changes of the mid to lower spine. Mild levocurvature of the mid to lower thoracic  spine and mild dextrocurvature of the upper lumbar spine. IMPRESSION: 1. No active cardiopulmonary disease. 2. Mild-to-moderate chronic hyperinflation. Electronically Signed   By: Neita Garnet M.D.   On: 02/08/2023 12:05    Procedures Procedures    Medications Ordered in ED Medications  iohexol (OMNIPAQUE) 350 MG/ML injection 75 mL (75 mLs Intravenous Contrast Given 02/08/23 1637)    ED Course/ Medical Decision Making/ A&P                          Medical Decision Making Amount and/or Complexity of Data Reviewed Labs: ordered. Decision-making details documented in ED Course. Radiology: ordered. Decision-making details documented in ED  Course.  Risk Prescription drug management.    This patient presents to the ED for concern of palpitations, mild DOE; this involves an extensive number of treatment options, and is a complaint that carries with it a high risk of complications and morbidity.  I considered the following differential and admission for this acute, potentially life threatening condition.   MDM:    Patient palpitations and intermittent dyspnea on exertion.  She had an elevated D-dimer outpatient and was sent to the ED for CT PE.  She has minimal risk factors for PE but with elevated D-dimer and DOE will get CT PE today. Consider arrhythmia such as SVT, VT, AVNRT, or otherwise ST for patient's intermittent palpitations. She does endorse some orthostatic type lightheadedness and is possible she could just be having postural sinus tachycardia. Consider thyroid abnormalities or electrolyte derangements. Cannot locate labs performed earlier today. Patient did have echo/CXR today but cannot see results.  Clinical Course as of 02/08/23 1830  Wed Feb 08, 2023  1613 WBC: 7.4 No leukocytosis [HN]  1613 Hemoglobin: 14.7 No anemia [HN]  1628 Magnesium: 2.0 [HN]  1629 Basic metabolic panel(!) Unremarkable [HN]  1757 CT Angio Chest PE W and/or Wo Contrast 1. No filling defect is identified in the pulmonary arterial tree to suggest pulmonary embolus. 2. Scattered small nodules in the lungs all less than 5 mm in average diameter. In the portion of the lung that overlaps the cardiac CT from 08/25/2022, all of these nodules are stable. Some of the small nodules in the upper lobes have not been imaged in the past. No follow-up needed if patient is low-risk (and has no known or suspected primary neoplasm). Non-contrast chest CT can be considered in 12 months if patient is high-risk. This recommendation follows the consensus statement: Guidelines for Management of Incidental Pulmonary Nodules Detected on CT Images: From  the Fleischner Society 2017; Radiology 2017; 284:228-243. 3. Mild thoracic spondylosis and mild levoconvex thoracic scoliosis.   [HN]  1828 Pt reevaluated, reassurred. She is informed of nodules noted on CT PE and advised to f/u with PCP about these. She has no PE, no subclavian aneurysm (as patient was concerned for) on CT. She has TSH/FT4 pending but she can follow these on her MyChart and with PCP, she has no vital sign abnormalities that would indicate thyroid storm or myxedema coma that would require treatment emergently or admission to the hospital.  Patient is advised to continue her workup outpatient including the echocardiogram and Holter monitor.  I advised her to stay well-hydrated and cut out caffeine.  She will be discharged with discharge instructions and return precautions. [HN]    Clinical Course User Index [HN] Loetta Rough, MD    Labs: I Ordered, and personally interpreted labs.  The pertinent results include:  those lsited above  Imaging Studies ordered: I ordered imaging studies including CT PE I independently visualized and interpreted imaging. I agree with the radiologist interpretation  Additional history obtained from chart review, husband at bedside.   Cardiac Monitoring: The patient was maintained on a cardiac monitor.  I personally viewed and interpreted the cardiac monitored which showed an underlying rhythm of: NSR  Reevaluation: After the interventions noted above, I reevaluated the patient and found that they have :stayed the same  Social Determinants of Health: Patient lives independently   Disposition:  DC w/ PCP f/u and holter monitor  Co morbidities that complicate the patient evaluation  Past Medical History:  Diagnosis Date   Allergy    mushrooms   GERD (gastroesophageal reflux disease)    Migraines    Renal agenesis    CONGENITAL, RIGHT,  only kidney; has left kidney only     Medicines Meds ordered this encounter  Medications    iohexol (OMNIPAQUE) 350 MG/ML injection 75 mL    I have reviewed the patients home medicines and have made adjustments as needed  Problem List / ED Course: Problem List Items Addressed This Visit   None Visit Diagnoses     Palpitations    -  Primary                   This note was created using dictation software, which may contain spelling or grammatical errors.    Loetta Rough, MD 02/08/23 [phone]

## 2023-02-21 DIAGNOSIS — R0609 Other forms of dyspnea: Secondary | ICD-10-CM | POA: Diagnosis not present

## 2023-02-21 DIAGNOSIS — R946 Abnormal results of thyroid function studies: Secondary | ICD-10-CM | POA: Diagnosis not present

## 2023-02-21 DIAGNOSIS — I499 Cardiac arrhythmia, unspecified: Secondary | ICD-10-CM | POA: Diagnosis not present

## 2023-04-06 DIAGNOSIS — R946 Abnormal results of thyroid function studies: Secondary | ICD-10-CM | POA: Diagnosis not present

## 2023-04-26 ENCOUNTER — Ambulatory Visit: Payer: BC Managed Care – PPO | Attending: Cardiology | Admitting: Cardiology

## 2023-04-26 ENCOUNTER — Encounter: Payer: Self-pay | Admitting: Cardiology

## 2023-04-26 VITALS — BP 112/70 | HR 74 | Ht 65.0 in | Wt 130.0 lb

## 2023-04-26 DIAGNOSIS — E063 Autoimmune thyroiditis: Secondary | ICD-10-CM | POA: Diagnosis not present

## 2023-04-26 DIAGNOSIS — R0602 Shortness of breath: Secondary | ICD-10-CM | POA: Diagnosis not present

## 2023-04-26 DIAGNOSIS — R002 Palpitations: Secondary | ICD-10-CM | POA: Diagnosis not present

## 2023-04-26 DIAGNOSIS — E785 Hyperlipidemia, unspecified: Secondary | ICD-10-CM

## 2023-04-26 NOTE — Progress Notes (Signed)
Cardiology Office Note:    Date:  04/26/2023   ID:  Andrea Ho, DOB 04-01-1961, MRN [id number]  PCP:  Aliene Beams, MD   [facility] Providers Cardiologist:  None     Referring MD: Aliene Beams, MD    History of Present Illness:    Andrea Ho is a 62 y.o. female here for the evaluation of palpitations.  Has been having some irregular heartbeat type sensation, pulsating over her collarbone intermittently.  Mild shortness of breath when going up stairs.  Mother has had multiple myocardial infarctions. Died 3. Father AFIB congenital left kidney fairly anxious about her symptoms.  Hemoglobin 13.9 ALT 14 potassium 4.6 creatinine 0.8 TSH 7.45 elevated T4 was normal, however thyroid antibodies were abnormal compatible with Hashimoto's disease.  EKG from outside source showed sinus bradycardia 59 bpm with no other abnormalities.  February 21 2023 - Dx Hashimoto. Now synthroid.   Feels much better on medication.  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 Past Medical History:  Diagnosis Date   Allergy    mushrooms   GERD (gastroesophageal reflux disease)    Migraines    Renal agenesis    CONGENITAL, RIGHT,  only kidney; has left kidney only    Past Surgical History:  Procedure Laterality Date   AUGMENTATION MAMMAPLASTY  08/2005   SALINE,  DR. BEAN   BREAST IMPLANT EXCHANGE Bilateral 08/26/2014   INTRAUTERINE DEVICE INSERTION  07/16/2010   PARAGUARD and removed   RHINOPLASTY     TONSILLECTOMY AND ADENOIDECTOMY     UPPER GASTROINTESTINAL ENDOSCOPY      Current Medications: Current Meds  Medication Sig   diphenhydrAMINE (BENADRYL) 25 MG tablet Take 25-50 mg by mouth at bedtime as needed.   levothyroxine (SYNTHROID) 50 MCG tablet Take 50 mcg by mouth daily.   Promethazine HCl (PHENERGAN PO) Take by mouth as needed.   rizatriptan (MAXALT) 10 MG tablet Take 10 mg by mouth as needed. May repeat in 2 hours if needed    Current  Facility-Administered Medications for the 04/26/23 encounter (Office Visit) with Jake Bathe, MD  Medication   0.9 %  sodium chloride infusion     Allergies:   Bactrim, Codeine, and Other   Social History   Socioeconomic History   Marital status: Married    Spouse name: Not on file   Number of children: 1   Years of education: Not on file   Highest education level: Not on file  Occupational History   Not on file  Tobacco Use   Smoking status: Never   Smokeless tobacco: Never  Substance and Sexual Activity   Alcohol use: Yes    Alcohol/week: 4.0 standard drinks of alcohol    Types: 2 Glasses of wine, 2 Cans of beer per week    Comment: 3 - 4 times a week.Marland KitchenBEER, WINE   Drug use: No   Sexual activity: Yes    Birth control/protection: None, Post-menopausal  Other Topics Concern   Not on file  Social History Narrative   Not on file   Social Determinants of Health   Financial Resource Strain: Not on file  Food Insecurity: Not on file  Transportation Needs: Not on file  Physical Activity: Not on file  Stress: Not on file  Social Connections: Not on file     Family History: The patient's family history includes Cervical cancer in her mother; Colon cancer (age of onset: 51) in her paternal grandmother; Heart attack in her father;  Heart attack (age of onset: 22) in her mother; Hypertension in her father, mother, and sister; Stroke in her father. There is no history of Esophageal cancer, Rectal cancer, Stomach cancer, or Breast cancer.  ROS:   Please see the history of present illness.     All other systems reviewed and are negative.  EKGs/Labs/Other Studies Reviewed:    The following studies were reviewed today: Cardiac Studies & Procedures         MONITORS  LONG TERM MONITOR (3-14 DAYS) 02/27/2023  Narrative   Sinus rhythm with HR 73 bpm on avg   First degree AVB present   Rare brief atrial tachycardia - benign   Rare PAC's   Frequent PVC's, 8.5%, symptomatic  at times.   Has upcoming appt. to discuss   Patch Wear Time:  13 days and 19 hours (2024-05-22T20:03:03-399 to 2024-06-05T15:42:30-0400)  Patient had a min HR of 43 bpm, max HR of 178 bpm, and avg HR of 73 bpm. Predominant underlying rhythm was Sinus Rhythm. First Degree AV Block was present. 1 run of Ventricular Tachycardia occurred lasting 9 beats with a max rate of 130 bpm (avg 110 bpm). 22 Supraventricular Tachycardia runs occurred, the run with the fastest interval lasting 9 beats with a max rate of 156 bpm, the longest lasting 15 beats with an avg rate of 102 bpm. Some episodes of Supraventricular Tachycardia may be possible Atrial Tachycardia with variable block. Ventricular Tachycardia was detected within +/- 45 seconds of symptomatic patient event(s). Isolated SVEs were rare (<1.0%), SVE Couplets were rare (<1.0%), and SVE Triplets were rare (<1.0%). Isolated VEs were frequent (8.5%, [id number]), VE Couplets were rare (<1.0%, 8), and no VE Triplets were present. Ventricular Bigeminy and Trigeminy were present.   CT SCANS  CT CARDIAC SCORING (SELF PAY ONLY) 08/25/2022  Addendum 08/26/2022 10:14 AM ADDENDUM REPORT: 08/26/2022 10:11  EXAM: OVER-READ INTERPRETATION  PET-CT CHEST  The following report is an over-read performed by radiologist Dr. Leatha Gilding [facility] on 08/26/2022. This over-read does not include interpretation of cardiac or coronary anatomy or pathology. The cardiac CT interpretation by the cardiologist is to be attached.  COMPARISON:  None.  FINDINGS: No evidence for lymphadenopathy within the visualized mediastinum or hilar regions.  Scattered tiny bilateral pulmonary nodules are identified. Index nodules include 5 mm left upper lobe nodule on image 11/series 4, 3 mm index nodule left lower lobe on 19/4, right upper lobe 5 mm nodule on image 1/4 and 3 mm right lower lobe nodule on image 2/4.  Visualized portions of the upper abdomen are  unremarkable.  No suspicious lytic or sclerotic osseous abnormality.  IMPRESSION: Multiple bilateral pulmonary nodules measuring up to 5 mm. Likely post infectious/inflammatory. No follow-up needed if patient is low-risk (and has no known or suspected primary neoplasm). Non-contrast chest CT can be considered in 12 months if patient is high-risk. This recommendation follows the consensus statement: Guidelines for Management of Incidental Pulmonary Nodules Detected on CT Images: From the Fleischner Society 2017; Radiology 2017; 284:228-243.   Electronically Signed By: Kennith Center M.D. On: 08/26/2022 10:11  Narrative CLINICAL DATA:  Cardiovascular Disease Risk stratification  EXAM: Coronary Calcium Score  TECHNIQUE: A gated, non-contrast computed tomography scan of the heart was performed using 3mm slice thickness. Axial images were analyzed on a dedicated workstation. Calcium scoring of the coronary arteries was performed using the Agatston method.  FINDINGS: Coronary Calcium Score:  Left main: 0  Left anterior descending artery: 0  Left circumflex  artery: 0  Right coronary artery: 0  Total: 0  Percentile: NA  Pericardium: Normal.  Non-cardiac: See separate report from [facility].  IMPRESSION: 1. Coronary calcium score of 0.  RECOMMENDATIONS: Coronary artery calcium (CAC) score is a strong predictor of incident coronary heart disease (CHD) and provides predictive information beyond traditional risk factors. CAC scoring is reasonable to use in the decision to withhold, postpone, or initiate statin therapy in intermediate-risk or selected borderline-risk asymptomatic adults (age 3-75 years and LDL-C >=70 to <190 mg/dL) who do not have diabetes or established atherosclerotic cardiovascular disease (ASCVD).* In intermediate-risk (10-year ASCVD risk >=7.5% to <20%) adults or selected borderline-risk (10-year ASCVD risk >=5% to <7.5%) adults in  whom a CAC score is measured for the purpose of making a treatment decision the following recommendations have been made:  If CAC=0, it is reasonable to withhold statin therapy and reassess in 5 to 10 years, as long as higher risk conditions are absent (diabetes mellitus, family history of premature CHD in first degree relatives (males <55 years; females <65 years), cigarette smoking, or LDL >=190 mg/dL).  If CAC is 1 to 99, it is reasonable to initiate statin therapy for patients >=36 years of age.  If CAC is >=100 or >=75th percentile, it is reasonable to initiate statin therapy at any age.  Cardiology referral should be considered for patients with CAC scores >=400 or >=75th percentile.  *2018 AHA/ACC/AACVPR/AAPA/ABC/ACPM/ADA/AGS/APhA/ASPC/NLA/PCNA Guideline on the Management of Blood Cholesterol: A Report of the American College of Cardiology/American Heart Association Task Force on Clinical Practice Guidelines. J Am Coll Cardiol. 2019;73(24):3168-3209.  Lennie Odor, MD  Electronically Signed: By: Lennie Odor M.D. On: 08/26/2022 06:44           EKG: Prior EKG reviewed from outside source analysis, sinus bradycardia 58.   Recent Labs: 02/08/2023: BUN 17; Creatinine, Ser 1.02; Hemoglobin 14.7; Magnesium 2.0; Platelets 214; Potassium 3.9; Sodium 136; TSH 11.891  Recent Lipid Panel    Component Value Date/Time   CHOL 222 (H) 04/23/2014 0906   TRIG 80.0 04/23/2014 0906   HDL 81.40 04/23/2014 0906   CHOLHDL 3 04/23/2014 0906   VLDL 16.0 04/23/2014 0906   LDLCALC 125 (H) 04/23/2014 0906     Risk Assessment/Calculations:                Physical Exam:    VS:  BP 112/70   Pulse 74   Ht 5\' 5"  (1.651 m)   Wt 130 lb (59 kg)   LMP 06/17/2010   SpO2 96%   BMI 21.63 kg/m     Wt Readings from Last 3 Encounters:  04/26/23 130 lb (59 kg)  02/08/23 130 lb (59 kg)  07/13/16 134 lb (60.8 kg)     GEN:  Well nourished, well developed in no acute  distress HEENT: Normal NECK: No JVD; No carotid bruits LYMPHATICS: No lymphadenopathy CARDIAC: RRR, no murmurs, rubs, gallops RESPIRATORY:  Clear to auscultation without rales, wheezing or rhonchi  ABDOMEN: Soft, non-tender, non-distended MUSCULOSKELETAL:  No edema; No deformity  SKIN: Warm and dry NEUROLOGIC:  Alert and oriented x 3 PSYCHIATRIC:  Normal affect   ASSESSMENT:    1. Shortness of breath   2. Hyperlipidemia, unspecified hyperlipidemia type   3. Palpitations   4. Hashimoto's disease    PLAN:    In order of problems listed above:  Shortness of breath Palpitations PVCs/PACs Hashimoto's disease/hypothyroidism - We will check an echocardiogram to ensure proper structure and function of her heart.  Unfortunately I do not have the images from her prior echocardiogram to review. - Thankfully, her symptoms of PVCs have improved since treatment of hypothyroidism has taken place.  Continue with good sleep hygiene, daily exercise, avoidance of heavy caffeine, EtOH. -PVCs were 8% on monitor. -No evidence of atrial fibrillation present. -Her symptoms of seeing her pulsatile blood vessel above her clavicles was synonymous with PVCs.  This is a normal physiologic phenomenon.  CT scan of chest personally reviewed showed no evidence of aortic aneurysm.  Isolated left kidney  -Avoid heavy NSAIDs.  Mixed hyperlipidemia - LDL 151, coronary calcium was 0.  We will check an LP(a).  Continue with Mediterranean diet, exercise.          Medication Adjustments/Labs and Tests Ordered: Current medicines are reviewed at length with the patient today.  Concerns regarding medicines are outlined above.  Orders Placed This Encounter  Procedures   Lipoprotein A (LPA)   ECHOCARDIOGRAM COMPLETE   No orders of the defined types were placed in this encounter.   Patient Instructions  Medication Instructions:  The current medical regimen is effective;  continue present plan and  medications.  *If you need a refill on your cardiac medications before your next appointment, please call your pharmacy*   Lab Work: Please have blood work today (LPa)  If you have labs (blood work) drawn today and your tests are completely normal, you will receive your results only by: MyChart Message (if you have MyChart) OR A paper copy in the mail If you have any lab test that is abnormal or we need to change your treatment, we will call you to review the results.   Testing/Procedures: Your physician has requested that you have an echocardiogram. Echocardiography is a painless test that uses sound waves to create images of your heart. It provides your doctor with information about the size and shape of your heart and how well your heart's chambers and valves are working. This procedure takes approximately one hour. There are no restrictions for this procedure. Please do NOT wear cologne, perfume, aftershave, or lotions (deodorant is allowed). Please arrive 15 minutes prior to your appointment time.   Follow-Up: At [facility], you and your health needs are our priority.  As part of our continuing mission to provide you with exceptional heart care, we have created designated Provider Care Teams.  These Care Teams include your primary Cardiologist (physician) and Advanced Practice Providers (APPs -  Physician Assistants and Nurse Practitioners) who all work together to provide you with the care you need, when you need it.  We recommend signing up for the patient portal called "MyChart".  Sign up information is provided on this After Visit Summary.  MyChart is used to connect with patients for Virtual Visits (Telemedicine).  Patients are able to view lab/test results, encounter notes, upcoming appointments, etc.  Non-urgent messages can be sent to your provider as well.   To learn more about what you can do with MyChart, go to [email or web address].    Your next appointment:    Follow up will be based on the results of the above testing.     Signed, Donato Schultz, MD  04/26/2023 10:46 AM    [facility]

## 2023-04-26 NOTE — Patient Instructions (Signed)
Medication Instructions:  The current medical regimen is effective;  continue present plan and medications.  *If you need a refill on your cardiac medications before your next appointment, please call your pharmacy*   Lab Work: Please have blood work today (LPa)  If you have labs (blood work) drawn today and your tests are completely normal, you will receive your results only by: MyChart Message (if you have MyChart) OR A paper copy in the mail If you have any lab test that is abnormal or we need to change your treatment, we will call you to review the results.   Testing/Procedures: Your physician has requested that you have an echocardiogram. Echocardiography is a painless test that uses sound waves to create images of your heart. It provides your doctor with information about the size and shape of your heart and how well your heart's chambers and valves are working. This procedure takes approximately one hour. There are no restrictions for this procedure. Please do NOT wear cologne, perfume, aftershave, or lotions (deodorant is allowed). Please arrive 15 minutes prior to your appointment time.   Follow-Up: At [facility], you and your health needs are our priority.  As part of our continuing mission to provide you with exceptional heart care, we have created designated Provider Care Teams.  These Care Teams include your primary Cardiologist (physician) and Advanced Practice Providers (APPs -  Physician Assistants and Nurse Practitioners) who all work together to provide you with the care you need, when you need it.  We recommend signing up for the patient portal called "MyChart".  Sign up information is provided on this After Visit Summary.  MyChart is used to connect with patients for Virtual Visits (Telemedicine).  Patients are able to view lab/test results, encounter notes, upcoming appointments, etc.  Non-urgent messages can be sent to your provider as well.   To learn more  about what you can do with MyChart, go to [email or web address].    Your next appointment:   Follow up will be based on the results of the above testing.

## 2023-04-28 ENCOUNTER — Encounter: Payer: Self-pay | Admitting: *Deleted

## 2023-05-12 DIAGNOSIS — H903 Sensorineural hearing loss, bilateral: Secondary | ICD-10-CM | POA: Diagnosis not present

## 2023-05-16 ENCOUNTER — Ambulatory Visit (HOSPITAL_COMMUNITY): Payer: BC Managed Care – PPO | Attending: Cardiology

## 2023-05-16 DIAGNOSIS — R0602 Shortness of breath: Secondary | ICD-10-CM | POA: Diagnosis not present

## 2023-05-16 LAB — ECHOCARDIOGRAM COMPLETE
Area-P 1/2: 3.19 cm2
S' Lateral: 2.4 cm

## 2023-05-23 DIAGNOSIS — E039 Hypothyroidism, unspecified: Secondary | ICD-10-CM | POA: Diagnosis not present

## 2023-05-30 ENCOUNTER — Other Ambulatory Visit: Payer: Self-pay | Admitting: Obstetrics and Gynecology

## 2023-05-30 ENCOUNTER — Other Ambulatory Visit (HOSPITAL_COMMUNITY)
Admission: RE | Admit: 2023-05-30 | Discharge: 2023-05-30 | Disposition: A | Discharge status: Home / Self Care | Payer: BC Managed Care – PPO | Source: Ambulatory Visit | Attending: Obstetrics and Gynecology | Admitting: Obstetrics and Gynecology

## 2023-05-30 DIAGNOSIS — Z01419 Encounter for gynecological examination (general) (routine) without abnormal findings: Secondary | ICD-10-CM | POA: Diagnosis not present

## 2023-05-30 DIAGNOSIS — N871 Moderate cervical dysplasia: Secondary | ICD-10-CM | POA: Insufficient documentation

## 2023-06-02 LAB — CYTOLOGY - PAP
Comment: NEGATIVE
Diagnosis: NEGATIVE
High risk HPV: NEGATIVE

## 2023-06-16 DIAGNOSIS — G4700 Insomnia, unspecified: Secondary | ICD-10-CM | POA: Diagnosis not present

## 2023-06-16 DIAGNOSIS — N390 Urinary tract infection, site not specified: Secondary | ICD-10-CM | POA: Diagnosis not present

## 2023-06-28 DIAGNOSIS — D485 Neoplasm of uncertain behavior of skin: Secondary | ICD-10-CM | POA: Diagnosis not present

## 2023-06-28 DIAGNOSIS — L718 Other rosacea: Secondary | ICD-10-CM | POA: Diagnosis not present

## 2023-06-28 DIAGNOSIS — L538 Other specified erythematous conditions: Secondary | ICD-10-CM | POA: Diagnosis not present

## 2023-07-05 DIAGNOSIS — L923 Foreign body granuloma of the skin and subcutaneous tissue: Secondary | ICD-10-CM | POA: Diagnosis not present

## 2023-07-05 DIAGNOSIS — Z48817 Encounter for surgical aftercare following surgery on the skin and subcutaneous tissue: Secondary | ICD-10-CM | POA: Diagnosis not present

## 2023-07-20 DIAGNOSIS — I499 Cardiac arrhythmia, unspecified: Secondary | ICD-10-CM | POA: Diagnosis not present

## 2023-07-20 DIAGNOSIS — E063 Autoimmune thyroiditis: Secondary | ICD-10-CM | POA: Diagnosis not present

## 2023-07-20 DIAGNOSIS — E039 Hypothyroidism, unspecified: Secondary | ICD-10-CM | POA: Diagnosis not present

## 2023-07-20 DIAGNOSIS — R5383 Other fatigue: Secondary | ICD-10-CM | POA: Diagnosis not present

## 2023-07-21 DIAGNOSIS — G4700 Insomnia, unspecified: Secondary | ICD-10-CM | POA: Diagnosis not present

## 2023-07-21 DIAGNOSIS — E063 Autoimmune thyroiditis: Secondary | ICD-10-CM | POA: Diagnosis not present

## 2023-07-21 DIAGNOSIS — Z1159 Encounter for screening for other viral diseases: Secondary | ICD-10-CM | POA: Diagnosis not present

## 2023-07-21 DIAGNOSIS — B3731 Acute candidiasis of vulva and vagina: Secondary | ICD-10-CM | POA: Diagnosis not present

## 2023-07-21 DIAGNOSIS — Z0000 Encounter for general adult medical examination without abnormal findings: Secondary | ICD-10-CM | POA: Diagnosis not present

## 2023-07-21 DIAGNOSIS — Z23 Encounter for immunization: Secondary | ICD-10-CM | POA: Diagnosis not present

## 2023-07-21 DIAGNOSIS — Z13220 Encounter for screening for lipoid disorders: Secondary | ICD-10-CM | POA: Diagnosis not present

## 2023-10-23 DIAGNOSIS — F5104 Psychophysiologic insomnia: Secondary | ICD-10-CM | POA: Diagnosis not present

## 2023-11-17 DIAGNOSIS — L821 Other seborrheic keratosis: Secondary | ICD-10-CM | POA: Diagnosis not present

## 2023-11-17 DIAGNOSIS — L814 Other melanin hyperpigmentation: Secondary | ICD-10-CM | POA: Diagnosis not present

## 2023-11-17 DIAGNOSIS — D225 Melanocytic nevi of trunk: Secondary | ICD-10-CM | POA: Diagnosis not present

## 2023-11-17 DIAGNOSIS — L570 Actinic keratosis: Secondary | ICD-10-CM | POA: Diagnosis not present

## 2023-11-22 DIAGNOSIS — E063 Autoimmune thyroiditis: Secondary | ICD-10-CM | POA: Diagnosis not present

## 2023-12-01 ENCOUNTER — Other Ambulatory Visit: Payer: Self-pay | Admitting: Obstetrics and Gynecology

## 2023-12-01 ENCOUNTER — Other Ambulatory Visit (HOSPITAL_COMMUNITY)
Admission: RE | Admit: 2023-12-01 | Discharge: 2023-12-01 | Disposition: A | Discharge status: Home / Self Care | Source: Ambulatory Visit | Attending: Obstetrics and Gynecology | Admitting: Obstetrics and Gynecology

## 2023-12-01 DIAGNOSIS — N871 Moderate cervical dysplasia: Secondary | ICD-10-CM | POA: Diagnosis not present

## 2023-12-01 DIAGNOSIS — Z1151 Encounter for screening for human papillomavirus (HPV): Secondary | ICD-10-CM | POA: Diagnosis not present

## 2023-12-01 DIAGNOSIS — N898 Other specified noninflammatory disorders of vagina: Secondary | ICD-10-CM | POA: Diagnosis not present

## 2023-12-01 DIAGNOSIS — N952 Postmenopausal atrophic vaginitis: Secondary | ICD-10-CM | POA: Diagnosis not present

## 2023-12-01 DIAGNOSIS — R350 Frequency of micturition: Secondary | ICD-10-CM | POA: Diagnosis not present

## 2023-12-05 LAB — CYTOLOGY - PAP
Comment: NEGATIVE
Comment: NEGATIVE
Comment: NEGATIVE
Diagnosis: NEGATIVE
HPV 16: NEGATIVE
HPV 18 / 45: NEGATIVE
High risk HPV: POSITIVE — AB

## 2023-12-12 ENCOUNTER — Other Ambulatory Visit: Payer: Self-pay | Admitting: Family Medicine

## 2023-12-12 DIAGNOSIS — Z1231 Encounter for screening mammogram for malignant neoplasm of breast: Secondary | ICD-10-CM

## 2024-01-17 ENCOUNTER — Ambulatory Visit

## 2024-01-25 ENCOUNTER — Ambulatory Visit
Admission: RE | Admit: 2024-01-25 | Discharge: 2024-01-25 | Disposition: A | Discharge status: Home / Self Care | Source: Ambulatory Visit | Attending: Family Medicine | Admitting: Family Medicine

## 2024-01-25 DIAGNOSIS — Z1231 Encounter for screening mammogram for malignant neoplasm of breast: Secondary | ICD-10-CM

## 2024-02-07 ENCOUNTER — Other Ambulatory Visit: Payer: Self-pay | Admitting: Family Medicine

## 2024-02-07 DIAGNOSIS — R918 Other nonspecific abnormal finding of lung field: Secondary | ICD-10-CM

## 2024-02-08 DIAGNOSIS — L570 Actinic keratosis: Secondary | ICD-10-CM | POA: Diagnosis not present

## 2024-02-08 DIAGNOSIS — Z09 Encounter for follow-up examination after completed treatment for conditions other than malignant neoplasm: Secondary | ICD-10-CM | POA: Diagnosis not present

## 2024-02-08 DIAGNOSIS — L708 Other acne: Secondary | ICD-10-CM | POA: Diagnosis not present

## 2024-02-08 DIAGNOSIS — L821 Other seborrheic keratosis: Secondary | ICD-10-CM | POA: Diagnosis not present

## 2024-03-14 DIAGNOSIS — E063 Autoimmune thyroiditis: Secondary | ICD-10-CM | POA: Diagnosis not present

## 2024-05-23 ENCOUNTER — Other Ambulatory Visit (HOSPITAL_COMMUNITY)
Admission: RE | Admit: 2024-05-23 | Discharge: 2024-05-23 | Disposition: A | Discharge status: Home / Self Care | Source: Ambulatory Visit | Attending: Obstetrics and Gynecology | Admitting: Obstetrics and Gynecology

## 2024-05-23 ENCOUNTER — Other Ambulatory Visit: Payer: Self-pay | Admitting: Obstetrics and Gynecology

## 2024-05-23 DIAGNOSIS — Z01419 Encounter for gynecological examination (general) (routine) without abnormal findings: Secondary | ICD-10-CM | POA: Diagnosis not present

## 2024-05-23 DIAGNOSIS — N871 Moderate cervical dysplasia: Secondary | ICD-10-CM | POA: Insufficient documentation

## 2024-05-27 LAB — CYTOLOGY - PAP
Comment: NEGATIVE
Diagnosis: NEGATIVE
Diagnosis: REACTIVE
High risk HPV: NEGATIVE

## 2024-06-14 ENCOUNTER — Other Ambulatory Visit: Payer: Self-pay | Admitting: Family Medicine

## 2024-06-14 DIAGNOSIS — R918 Other nonspecific abnormal finding of lung field: Secondary | ICD-10-CM

## 2024-06-17 ENCOUNTER — Encounter: Payer: Self-pay | Admitting: Family Medicine

## 2024-06-20 ENCOUNTER — Encounter: Payer: Self-pay | Admitting: Radiology

## 2024-06-20 ENCOUNTER — Ambulatory Visit
Admission: RE | Admit: 2024-06-20 | Discharge: 2024-06-20 | Disposition: A | Discharge status: Home / Self Care | Source: Ambulatory Visit | Attending: Family Medicine | Admitting: Family Medicine

## 2024-06-20 DIAGNOSIS — J8410 Pulmonary fibrosis, unspecified: Secondary | ICD-10-CM | POA: Diagnosis not present

## 2024-06-20 DIAGNOSIS — R918 Other nonspecific abnormal finding of lung field: Secondary | ICD-10-CM

## 2024-06-20 MED ORDER — IOPAMIDOL (ISOVUE-300) INJECTION 61%
75.0000 mL | Freq: Once | INTRAVENOUS | Status: AC | PRN
  Administered 2024-06-20: 75 mL via INTRAVENOUS

## 2024-08-29 DIAGNOSIS — G43009 Migraine without aura, not intractable, without status migrainosus: Secondary | ICD-10-CM | POA: Diagnosis not present

## 2024-08-29 DIAGNOSIS — I700 Atherosclerosis of aorta: Secondary | ICD-10-CM | POA: Diagnosis not present

## 2024-08-29 DIAGNOSIS — Z0000 Encounter for general adult medical examination without abnormal findings: Secondary | ICD-10-CM | POA: Diagnosis not present

## 2024-08-29 DIAGNOSIS — E785 Hyperlipidemia, unspecified: Secondary | ICD-10-CM | POA: Diagnosis not present

## 2024-08-29 DIAGNOSIS — E063 Autoimmune thyroiditis: Secondary | ICD-10-CM | POA: Diagnosis not present
